# Patient Record
Sex: Female | Born: 1970
Health system: Southern US, Community
[De-identification: ages and names within clinical notes are randomized; demographics above are authoritative.]

## PROBLEM LIST (undated history)

## (undated) DIAGNOSIS — N764 Abscess of vulva: Secondary | ICD-10-CM

## (undated) DIAGNOSIS — O1490 Unspecified pre-eclampsia, unspecified trimester: Secondary | ICD-10-CM

## (undated) DIAGNOSIS — O09899 Supervision of other high risk pregnancies, unspecified trimester: Secondary | ICD-10-CM

## (undated) DIAGNOSIS — B019 Varicella without complication: Secondary | ICD-10-CM

## (undated) DIAGNOSIS — E785 Hyperlipidemia, unspecified: Secondary | ICD-10-CM

## (undated) DIAGNOSIS — Z0000 Encounter for general adult medical examination without abnormal findings: Principal | ICD-10-CM

## (undated) DIAGNOSIS — I8390 Asymptomatic varicose veins of unspecified lower extremity: Secondary | ICD-10-CM

## (undated) DIAGNOSIS — I82409 Acute embolism and thrombosis of unspecified deep veins of unspecified lower extremity: Secondary | ICD-10-CM

## (undated) DIAGNOSIS — C50919 Malignant neoplasm of unspecified site of unspecified female breast: Secondary | ICD-10-CM

## (undated) HISTORY — DX: Acute embolism and thrombosis of unspecified deep veins of unspecified lower extremity: I82.409

## (undated) HISTORY — DX: Varicella without complication: B01.9

## (undated) HISTORY — DX: Supervision of other high risk pregnancies, unspecified trimester: O09.899

## (undated) HISTORY — DX: Abscess of vulva: N76.4

## (undated) HISTORY — PX: WISDOM TOOTH EXTRACTION: SHX21

## (undated) HISTORY — DX: Encounter for general adult medical examination without abnormal findings: Z00.00

## (undated) HISTORY — DX: Hyperlipidemia, unspecified: E78.5

## (undated) HISTORY — DX: Unspecified pre-eclampsia, unspecified trimester: O14.90

## (undated) HISTORY — PX: VARICOSE VEIN SURGERY: SHX832

## (undated) HISTORY — DX: Malignant neoplasm of unspecified site of unspecified female breast: C50.919

## (undated) HISTORY — DX: Asymptomatic varicose veins of unspecified lower extremity: I83.90

---

## 2003-06-25 DIAGNOSIS — O1490 Unspecified pre-eclampsia, unspecified trimester: Secondary | ICD-10-CM

## 2003-06-25 DIAGNOSIS — O1420 HELLP syndrome (HELLP), unspecified trimester: Secondary | ICD-10-CM

## 2012-08-04 ENCOUNTER — Encounter: Payer: Self-pay | Admitting: Family Medicine

## 2012-08-04 ENCOUNTER — Ambulatory Visit (INDEPENDENT_AMBULATORY_CARE_PROVIDER_SITE_OTHER): Payer: BC Managed Care – PPO | Admitting: Family Medicine

## 2012-08-04 VITALS — BP 128/92 | HR 68 | Temp 98.6°F | Ht 64.0 in | Wt 136.8 lb

## 2012-08-04 DIAGNOSIS — R030 Elevated blood-pressure reading, without diagnosis of hypertension: Secondary | ICD-10-CM

## 2012-08-04 DIAGNOSIS — I8390 Asymptomatic varicose veins of unspecified lower extremity: Secondary | ICD-10-CM

## 2012-08-04 DIAGNOSIS — O1490 Unspecified pre-eclampsia, unspecified trimester: Secondary | ICD-10-CM | POA: Insufficient documentation

## 2012-08-04 DIAGNOSIS — O09299 Supervision of pregnancy with other poor reproductive or obstetric history, unspecified trimester: Secondary | ICD-10-CM

## 2012-08-04 DIAGNOSIS — N764 Abscess of vulva: Secondary | ICD-10-CM | POA: Insufficient documentation

## 2012-08-04 DIAGNOSIS — Z8759 Personal history of other complications of pregnancy, childbirth and the puerperium: Secondary | ICD-10-CM | POA: Insufficient documentation

## 2012-08-04 DIAGNOSIS — O09899 Supervision of other high risk pregnancies, unspecified trimester: Secondary | ICD-10-CM

## 2012-08-04 DIAGNOSIS — I82409 Acute embolism and thrombosis of unspecified deep veins of unspecified lower extremity: Secondary | ICD-10-CM

## 2012-08-04 DIAGNOSIS — IMO0001 Reserved for inherently not codable concepts without codable children: Secondary | ICD-10-CM

## 2012-08-04 DIAGNOSIS — Z0000 Encounter for general adult medical examination without abnormal findings: Secondary | ICD-10-CM | POA: Insufficient documentation

## 2012-08-04 DIAGNOSIS — E785 Hyperlipidemia, unspecified: Secondary | ICD-10-CM

## 2012-08-04 DIAGNOSIS — I82401 Acute embolism and thrombosis of unspecified deep veins of right lower extremity: Secondary | ICD-10-CM

## 2012-08-04 DIAGNOSIS — O09893 Supervision of other high risk pregnancies, third trimester: Secondary | ICD-10-CM

## 2012-08-04 HISTORY — DX: Supervision of other high risk pregnancies, unspecified trimester: O09.899

## 2012-08-04 HISTORY — DX: Abscess of vulva: N76.4

## 2012-08-04 HISTORY — DX: Asymptomatic varicose veins of unspecified lower extremity: I83.90

## 2012-08-04 HISTORY — DX: Encounter for general adult medical examination without abnormal findings: Z00.00

## 2012-08-04 HISTORY — DX: Hyperlipidemia, unspecified: E78.5

## 2012-08-04 LAB — CBC
HCT: 34.3 % — ABNORMAL LOW (ref 36.0–46.0)
Hemoglobin: 11.7 g/dL — ABNORMAL LOW (ref 12.0–15.0)
MCHC: 34 g/dL (ref 30.0–36.0)
MCV: 95 fl (ref 78.0–100.0)
RDW: 12.8 % (ref 11.5–14.6)

## 2012-08-04 LAB — TSH: TSH: 0.44 u[IU]/mL (ref 0.35–5.50)

## 2012-08-04 LAB — LIPID PANEL
Cholesterol: 207 mg/dL — ABNORMAL HIGH (ref 0–200)
HDL: 102.2 mg/dL (ref >39.00)
Triglycerides: 108 mg/dL (ref 0.0–149.0)

## 2012-08-04 LAB — RENAL FUNCTION PANEL
BUN: 15 mg/dL (ref 6–23)
Chloride: 101 mEq/L (ref 96–112)
Creatinine, Ser: 0.6 mg/dL (ref 0.4–1.2)
GFR: 124.82 mL/min (ref >60.00)
Glucose, Bld: 80 mg/dL (ref 70–99)
Phosphorus: 2.7 mg/dL (ref 2.3–4.6)

## 2012-08-04 LAB — HEPATIC FUNCTION PANEL
Albumin: 4.2 g/dL (ref 3.5–5.2)
Alkaline Phosphatase: 68 U/L (ref 39–117)
Total Protein: 7.6 g/dL (ref 6.0–8.3)

## 2012-08-04 LAB — C-REACTIVE PROTEIN: CRP: <0.5 mg/dL (ref 0.5–20.0)

## 2012-08-04 NOTE — Assessment & Plan Note (Signed)
Avoid trans fats, consider a krill oil cap daily, recheck in 1 year

## 2012-08-04 NOTE — Patient Instructions (Signed)
Probiotic such as Digestive Advantage  For the folliculitis alternate distilled white vinegar and Witch Hazel  Preventive Care for Adults, Female A healthy lifestyle and preventive care can promote health and wellness. Preventive health guidelines for women include the following key practices.  A routine yearly physical is a good way to check with your caregiver about your health and preventive screening. It is a chance to share any concerns and updates on your health, and to receive a thorough exam.  Visit your dentist for a routine exam and preventive care every 6 months. Brush your teeth twice a day and floss once a day. Good oral hygiene prevents tooth decay and gum disease.  The frequency of eye exams is based on your age, health, family medical history, use of contact lenses, and other factors. Follow your caregiver's recommendations for frequency of eye exams.  Eat a healthy diet. Foods like vegetables, fruits, whole grains, low-fat dairy products, and lean protein foods contain the nutrients you need without too many calories. Decrease your intake of foods high in solid fats, added sugars, and salt. Eat the right amount of calories for you.Get information about a proper diet from your caregiver, if necessary.  Regular physical exercise is one of the most important things you can do for your health. Most adults should get at least 150 minutes of moderate-intensity exercise (any activity that increases your heart rate and causes you to sweat) each week. In addition, most adults need muscle-strengthening exercises on 2 or more days a week.  Maintain a healthy weight. The body mass index (BMI) is a screening tool to identify possible weight problems. It provides an estimate of body fat based on height and weight. Your caregiver can help determine your BMI, and can help you achieve or maintain a healthy weight.For adults 20 years and older:  A BMI below 18.5 is considered underweight.  A BMI  of 18.5 to 24.9 is normal.  A BMI of 25 to 29.9 is considered overweight.  A BMI of 30 and above is considered obese.  Maintain normal blood lipids and cholesterol levels by exercising and minimizing your intake of saturated fat. Eat a balanced diet with plenty of fruit and vegetables. Blood tests for lipids and cholesterol should begin at age 52 and be repeated every 5 years. If your lipid or cholesterol levels are high, you are over 50, or you are at high risk for heart disease, you may need your cholesterol levels checked more frequently.Ongoing high lipid and cholesterol levels should be treated with medicines if diet and exercise are not effective.  If you smoke, find out from your caregiver how to quit. If you do not use tobacco, do not start.  If you are pregnant, do not drink alcohol. If you are breastfeeding, be very cautious about drinking alcohol. If you are not pregnant and choose to drink alcohol, do not exceed 1 drink per day. One drink is considered to be 12 ounces (355 mL) of beer, 5 ounces (148 mL) of wine, or 1.5 ounces (44 mL) of liquor.  Avoid use of street drugs. Do not share needles with anyone. Ask for help if you need support or instructions about stopping the use of drugs.  High blood pressure causes heart disease and increases the risk of stroke. Your blood pressure should be checked at least every 1 to 2 years. Ongoing high blood pressure should be treated with medicines if weight loss and exercise are not effective.  If you are 44  to 40 years old, ask your caregiver if you should take aspirin to prevent strokes.  Diabetes screening involves taking a blood sample to check your fasting blood sugar level. This should be done once every 3 years, after age 57, if you are within normal weight and without risk factors for diabetes. Testing should be considered at a younger age or be carried out more frequently if you are overweight and have at least 1 risk factor for  diabetes.  Breast cancer screening is essential preventive care for women. You should practice "breast self-awareness." This means understanding the normal appearance and feel of your breasts and may include breast self-examination. Any changes detected, no matter how small, should be reported to a caregiver. Women in their 7s and 30s should have a clinical breast exam (CBE) by a caregiver as part of a regular health exam every 1 to 3 years. After age 73, women should have a CBE every year. Starting at age 65, women should consider having a mammography (breast X-ray test) every year. Women who have a family history of breast cancer should talk to their caregiver about genetic screening. Women at a high risk of breast cancer should talk to their caregivers about having magnetic resonance imaging (MRI) and a mammography every year.  The Pap test is a screening test for cervical cancer. A Pap test can show cell changes on the cervix that might become cervical cancer if left untreated. A Pap test is a procedure in which cells are obtained and examined from the lower end of the uterus (cervix).  Women should have a Pap test starting at age 3.  Between ages 92 and 75, Pap tests should be repeated every 2 years.  Beginning at age 32, you should have a Pap test every 3 years as long as the past 3 Pap tests have been normal.  Some women have medical problems that increase the chance of getting cervical cancer. Talk to your caregiver about these problems. It is especially important to talk to your caregiver if a new problem develops soon after your last Pap test. In these cases, your caregiver may recommend more frequent screening and Pap tests.  The above recommendations are the same for women who have or have not gotten the vaccine for human papillomavirus (HPV).  If you had a hysterectomy for a problem that was not cancer or a condition that could lead to cancer, then you no longer need Pap tests. Even if  you no longer need a Pap test, a regular exam is a good idea to make sure no other problems are starting.  If you are between ages 63 and 41, and you have had normal Pap tests going back 10 years, you no longer need Pap tests. Even if you no longer need a Pap test, a regular exam is a good idea to make sure no other problems are starting.  If you have had past treatment for cervical cancer or a condition that could lead to cancer, you need Pap tests and screening for cancer for at least 20 years after your treatment.  If Pap tests have been discontinued, risk factors (such as a new sexual partner) need to be reassessed to determine if screening should be resumed.  The HPV test is an additional test that may be used for cervical cancer screening. The HPV test looks for the virus that can cause the cell changes on the cervix. The cells collected during the Pap test can be tested for  HPV. The HPV test could be used to screen women aged 26 years and older, and should be used in women of any age who have unclear Pap test results. After the age of 18, women should have HPV testing at the same frequency as a Pap test.  Colorectal cancer can be detected and often prevented. Most routine colorectal cancer screening begins at the age of 43 and continues through age 31. However, your caregiver may recommend screening at an earlier age if you have risk factors for colon cancer. On a yearly basis, your caregiver may provide home test kits to check for hidden blood in the stool. Use of a small camera at the end of a tube, to directly examine the colon (sigmoidoscopy or colonoscopy), can detect the earliest forms of colorectal cancer. Talk to your caregiver about this at age 79, when routine screening begins. Direct examination of the colon should be repeated every 5 to 10 years through age 37, unless early forms of pre-cancerous polyps or small growths are found.  Hepatitis C blood testing is recommended for all  people born from [birth year] through 1965 and any individual with known risks for hepatitis C.  Practice safe sex. Use condoms and avoid high-risk sexual practices to reduce the spread of sexually transmitted infections (STIs). STIs include gonorrhea, chlamydia, syphilis, trichomonas, herpes, HPV, and human immunodeficiency virus (HIV). Herpes, HIV, and HPV are viral illnesses that have no cure. They can result in disability, cancer, and death. Sexually active women aged 43 and younger should be checked for chlamydia. Older women with new or multiple partners should also be tested for chlamydia. Testing for other STIs is recommended if you are sexually active and at increased risk.  Osteoporosis is a disease in which the bones lose minerals and strength with aging. This can result in serious bone fractures. The risk of osteoporosis can be identified using a bone density scan. Women ages 75 and over and women at risk for fractures or osteoporosis should discuss screening with their caregivers. Ask your caregiver whether you should take a calcium supplement or vitamin D to reduce the rate of osteoporosis.  Menopause can be associated with physical symptoms and risks. Hormone replacement therapy is available to decrease symptoms and risks. You should talk to your caregiver about whether hormone replacement therapy is right for you.  Use sunscreen with sun protection factor (SPF) of 30 or more. Apply sunscreen liberally and repeatedly throughout the day. You should seek shade when your shadow is shorter than you. Protect yourself by wearing long sleeves, pants, a wide-brimmed hat, and sunglasses year round, whenever you are outdoors.  Once a month, do a whole body skin exam, using a mirror to look at the skin on your back. Notify your caregiver of new moles, moles that have irregular borders, moles that are larger than a pencil eraser, or moles that have changed in shape or color.  Stay current with required  immunizations.  Influenza. You need a dose every fall (or winter). The composition of the flu vaccine changes each year, so being vaccinated once is not enough.  Pneumococcal polysaccharide. You need 1 to 2 doses if you smoke cigarettes or if you have certain chronic medical conditions. You need 1 dose at age 28 (or older) if you have never been vaccinated.  Tetanus, diphtheria, pertussis (Tdap, Td). Get 1 dose of Tdap vaccine if you are younger than age 64, are over 45 and have contact with an infant, are a Research scientist (physical sciences),  are pregnant, or simply want to be protected from whooping cough. After that, you need a Td booster dose every 10 years. Consult your caregiver if you have not had at least 3 tetanus and diphtheria-containing shots sometime in your life or have a deep or dirty wound.  HPV. You need this vaccine if you are a woman age 59 or younger. The vaccine is given in 3 doses over 6 months.  Measles, mumps, rubella (MMR). You need at least 1 dose of MMR if you were born in 1957 or later. You may also need a second dose.  Meningococcal. If you are age 1 to 68 and a first-year college student living in a residence hall, or have one of several medical conditions, you need to get vaccinated against meningococcal disease. You may also need additional booster doses.  Zoster (shingles). If you are age 53 or older, you should get this vaccine.  Varicella (chickenpox). If you have never had chickenpox or you were vaccinated but received only 1 dose, talk to your caregiver to find out if you need this vaccine.  Hepatitis A. You need this vaccine if you have a specific risk factor for hepatitis A virus infection or you simply wish to be protected from this disease. The vaccine is usually given as 2 doses, 6 to 18 months apart.  Hepatitis B. You need this vaccine if you have a specific risk factor for hepatitis B virus infection or you simply wish to be protected from this disease. The vaccine is  given in 3 doses, usually over 6 months. Preventive Services / Frequency Ages 75 to 76  Blood pressure check.** / Every 1 to 2 years.  Lipid and cholesterol check.** / Every 5 years beginning at age 31.  Clinical breast exam.** / Every 3 years for women in their 2s and 30s.  Pap test.** / Every 2 years from ages 39 through 24. Every 3 years starting at age 6 through age 15 or 55 with a history of 3 consecutive normal Pap tests.  HPV screening.** / Every 3 years from ages 81 through ages 25 to 59 with a history of 3 consecutive normal Pap tests.  Hepatitis C blood test.** / For any individual with known risks for hepatitis C.  Skin self-exam. / Monthly.  Influenza immunization.** / Every year.  Pneumococcal polysaccharide immunization.** / 1 to 2 doses if you smoke cigarettes or if you have certain chronic medical conditions.  Tetanus, diphtheria, pertussis (Tdap, Td) immunization. / A one-time dose of Tdap vaccine. After that, you need a Td booster dose every 10 years.  HPV immunization. / 3 doses over 6 months, if you are 31 and younger.  Measles, mumps, rubella (MMR) immunization. / You need at least 1 dose of MMR if you were born in 1957 or later. You may also need a second dose.  Meningococcal immunization. / 1 dose if you are age 60 to 63 and a first-year college student living in a residence hall, or have one of several medical conditions, you need to get vaccinated against meningococcal disease. You may also need additional booster doses.  Varicella immunization.** / Consult your caregiver.  Hepatitis A immunization.** / Consult your caregiver. 2 doses, 6 to 18 months apart.  Hepatitis B immunization.** / Consult your caregiver. 3 doses usually over 6 months. Ages 27 to 54  Blood pressure check.** / Every 1 to 2 years.  Lipid and cholesterol check.** / Every 5 years beginning at age 46.  Clinical  breast exam.** / Every year after age 59.  Mammogram.** / Every year  beginning at age 62 and continuing for as long as you are in good health. Consult with your caregiver.  Pap test.** / Every 3 years starting at age 4 through age 65 or 33 with a history of 3 consecutive normal Pap tests.  HPV screening.** / Every 3 years from ages 72 through ages 61 to 37 with a history of 3 consecutive normal Pap tests.  Fecal occult blood test (FOBT) of stool. / Every year beginning at age 12 and continuing until age 65. You may not need to do this test if you get a colonoscopy every 10 years.  Flexible sigmoidoscopy or colonoscopy.** / Every 5 years for a flexible sigmoidoscopy or every 10 years for a colonoscopy beginning at age 17 and continuing until age 42.  Hepatitis C blood test.** / For all people born from [birth year] through 1965 and any individual with known risks for hepatitis C.  Skin self-exam. / Monthly.  Influenza immunization.** / Every year.  Pneumococcal polysaccharide immunization.** / 1 to 2 doses if you smoke cigarettes or if you have certain chronic medical conditions.  Tetanus, diphtheria, pertussis (Tdap, Td) immunization.** / A one-time dose of Tdap vaccine. After that, you need a Td booster dose every 10 years.  Measles, mumps, rubella (MMR) immunization. / You need at least 1 dose of MMR if you were born in 1957 or later. You may also need a second dose.  Varicella immunization.** / Consult your caregiver.  Meningococcal immunization.** / Consult your caregiver.  Hepatitis A immunization.** / Consult your caregiver. 2 doses, 6 to 18 months apart.  Hepatitis B immunization.** / Consult your caregiver. 3 doses, usually over 6 months. Ages 50 and over  Blood pressure check.** / Every 1 to 2 years.  Lipid and cholesterol check.** / Every 5 years beginning at age 13.  Clinical breast exam.** / Every year after age 18.  Mammogram.** / Every year beginning at age 79 and continuing for as long as you are in good health. Consult with your  caregiver.  Pap test.** / Every 3 years starting at age 46 through age 12 or 59 with a 3 consecutive normal Pap tests. Testing can be stopped between 65 and 70 with 3 consecutive normal Pap tests and no abnormal Pap or HPV tests in the past 10 years.  HPV screening.** / Every 3 years from ages 6 through ages 19 or 40 with a history of 3 consecutive normal Pap tests. Testing can be stopped between 65 and 70 with 3 consecutive normal Pap tests and no abnormal Pap or HPV tests in the past 10 years.  Fecal occult blood test (FOBT) of stool. / Every year beginning at age 48 and continuing until age 69. You may not need to do this test if you get a colonoscopy every 10 years.  Flexible sigmoidoscopy or colonoscopy.** / Every 5 years for a flexible sigmoidoscopy or every 10 years for a colonoscopy beginning at age 35 and continuing until age 30.  Hepatitis C blood test.** / For all people born from [birth year] through 1965 and any individual with known risks for hepatitis C.  Osteoporosis screening.** / A one-time screening for women ages 32 and over and women at risk for fractures or osteoporosis.  Skin self-exam. / Monthly.  Influenza immunization.** / Every year.  Pneumococcal polysaccharide immunization.** / 1 dose at age 57 (or older) if you have never been vaccinated.  Tetanus, diphtheria, pertussis (Tdap, Td) immunization. / A one-time dose of Tdap vaccine if you are over 65 and have contact with an infant, are a Research scientist (physical sciences), or simply want to be protected from whooping cough. After that, you need a Td booster dose every 10 years.  Varicella immunization.** / Consult your caregiver.  Meningococcal immunization.** / Consult your caregiver.  Hepatitis A immunization.** / Consult your caregiver. 2 doses, 6 to 18 months apart.  Hepatitis B immunization.** / Check with your caregiver. 3 doses, usually over 6 months. ** Family history and personal history of risk and conditions may change your  caregiver's recommendations. Document Released: 06/10/2001 Document Revised: 07/07/2011 Document Reviewed: 09/09/2010 [facility] Patient Information 2013 [place], [place].

## 2012-08-04 NOTE — Assessment & Plan Note (Signed)
Encouraged DASH diet and recheck at next visit.

## 2012-08-04 NOTE — Progress Notes (Signed)
Patient ID: Brandy Parsons, female   DOB: 05-05-1970, 42 y.o.   MRN: [id number] Brandy Parsons [id number] 1970-05-27 08/04/2012      Progress Note New Patient  Subjective  Chief Complaint  Chief Complaint  Patient presents with  . Establish Care    new patient    HPI  Patient is a 42 year old Caucasian female is recently relocated to the area and is in need of a primary .care physician. In general she's in good health. She has suffered with recurrent vulvar abscess for several years now. Has had it surgically once it recurred. It intermittently grows and drains to this day. Comes warm and uncomfortable. No recent trouble. No fevers or chills. No malaise or myalgias. Denies headaches chest pains palpitations shortness of breath GI complaints today. Her last pregnancy was very complicated by preeclampsia and ultimately helpp syndrome. Her son was born very prematurely at 26-1/2 weeks.   Past Medical History  Diagnosis Date  . Chicken pox as a child  . Pre-eclampsia at 26 weeks  . Previous pregnancy with HELLP syndrome, antepartum 08/04/2012  . Varicose veins 08/04/2012  . Other and unspecified hyperlipidemia 08/04/2012  . Preventative health care 08/04/2012  . DVT (deep venous thrombosis)     right after C section  . Vulvar abscess 08/04/2012    Past Surgical History  Procedure Laterality Date  . Cesarean section  06-25-03  . Wisdom tooth extraction  early 20's  . Varicose vein surgery Left     Family History  Problem Relation Age of Onset  . Dementia Maternal Grandmother     late age  . Alzheimer's disease Maternal Grandfather     History   Social History  . Marital Status: Married    Spouse Name: N/A    Number of Children: N/A  . Years of Education: N/A   Occupational History  . Not on file.   Social History Main Topics  . Smoking status: Never Smoker   . Smokeless tobacco: Never Used  . Alcohol Use: Yes     Comment: wine/beer occasionally  . Drug Use: No  . Sexually  Active: Yes -- Female partner(s)   Other Topics Concern  . Not on file   Social History Narrative  . No narrative on file    No current outpatient prescriptions on file prior to visit.   No current facility-administered medications on file prior to visit.    No Known Allergies  Review of Systems  Review of Systems  Constitutional: Negative for fever, chills and malaise/fatigue.  HENT: Negative for hearing loss, nosebleeds and congestion.   Eyes: Negative for discharge.  Respiratory: Negative for cough, sputum production, shortness of breath and wheezing.   Cardiovascular: Negative for chest pain, palpitations and leg swelling.  Gastrointestinal: Negative for heartburn, nausea, vomiting, abdominal pain, diarrhea, constipation and blood in stool.  Genitourinary: Negative for dysuria, urgency, frequency and hematuria.  Musculoskeletal: Negative for myalgias, back pain and falls.  Skin: Negative for rash.  Neurological: Negative for dizziness, tremors, sensory change, focal weakness, loss of consciousness, weakness and headaches.  Endo/Heme/Allergies: Negative for polydipsia. Does not bruise/bleed easily.  Psychiatric/Behavioral: Negative for depression and suicidal ideas. The patient is not nervous/anxious and does not have insomnia.     Objective  BP 148/89  Pulse 68  Temp(Src) 98.6 F (37 C) (Temporal)  Ht 5\' 4"  (1.626 m)  Wt 136 lb 12.8 oz (62.052 kg)  BMI 23.47 kg/m2  SpO2 100%  LMP 07/20/2012  Physical Exam  Physical  Exam  Constitutional: She is oriented to person, place, and time and well-developed, well-nourished, and in no distress. No distress.  HENT:  Head: Normocephalic and atraumatic.  Right Ear: External ear normal.  Left Ear: External ear normal.  Nose: Nose normal.  Mouth/Throat: Oropharynx is clear and moist. No oropharyngeal exudate.  Eyes: Conjunctivae are normal. Pupils are equal, round, and reactive to light. Right eye exhibits no discharge. Left  eye exhibits no discharge. No scleral icterus.  Neck: Normal range of motion. Neck supple. No thyromegaly present.  Cardiovascular: Normal rate, regular rhythm, normal heart sounds and intact distal pulses.   No murmur heard. Pulmonary/Chest: Effort normal and breath sounds normal. No respiratory distress. She has no wheezes. She has no rales.  Abdominal: Soft. Bowel sounds are normal. She exhibits no distension and no mass. There is no tenderness.  Musculoskeletal: Normal range of motion. She exhibits no edema and no tenderness.  Lymphadenopathy:    She has no cervical adenopathy.  Neurological: She is alert and oriented to person, place, and time. She has normal reflexes. No cranial nerve deficit. Coordination normal.  Skin: Skin is warm and dry. No rash noted. She is not diaphoretic.  Psychiatric: Mood, memory and affect normal.       Assessment & Plan  DVT (deep venous thrombosis) pregnancy related  Other and unspecified hyperlipidemia Avoid trans fats, consider a krill oil cap daily, recheck in 1 year   Varicose veins As tolerated left leg strippping and is going to proceed with right leg soon  Preventative health care Encouraged regular exercise, avoid trans fats, heart healthy diet. Referred to OB/GYN for evaluation   Vulvar abscess Present and worse and better now for several years. New to the area will refer to GYN for further consideration  Elevated BP Encouraged DASH diet and recheck at next visit.

## 2012-08-04 NOTE — Assessment & Plan Note (Signed)
As tolerated left leg strippping and is going to proceed with right leg soon

## 2012-08-04 NOTE — Assessment & Plan Note (Signed)
Present and worse and better now for several years. New to the area will refer to GYN for further consideration

## 2012-08-04 NOTE — Assessment & Plan Note (Signed)
Encouraged regular exercise, avoid trans fats, heart healthy diet. Referred to OB/GYN for evaluation

## 2012-08-04 NOTE — Assessment & Plan Note (Signed)
pregnancy related

## 2012-11-03 ENCOUNTER — Ambulatory Visit: Payer: BC Managed Care – PPO | Admitting: Family Medicine

## 2012-12-22 ENCOUNTER — Other Ambulatory Visit: Payer: Self-pay | Admitting: Obstetrics and Gynecology

## 2012-12-22 DIAGNOSIS — Z1231 Encounter for screening mammogram for malignant neoplasm of breast: Secondary | ICD-10-CM

## 2012-12-28 ENCOUNTER — Ambulatory Visit: Payer: BC Managed Care – PPO | Admitting: Family Medicine

## 2012-12-31 ENCOUNTER — Telehealth: Payer: Self-pay | Admitting: Hematology & Oncology

## 2012-12-31 NOTE — Telephone Encounter (Signed)
Left pt message to call and schedule appointment °

## 2013-01-03 ENCOUNTER — Telehealth: Payer: Self-pay | Admitting: Hematology & Oncology

## 2013-01-03 NOTE — Telephone Encounter (Signed)
Left pt message to call and schedule appointment °

## 2013-01-04 ENCOUNTER — Telehealth: Payer: Self-pay | Admitting: Hematology & Oncology

## 2013-01-04 NOTE — Telephone Encounter (Signed)
Pt stated she would call back to schedule, she got my message's and has seen a vein specialist so is not sure why she needs to see [place].

## 2013-01-06 ENCOUNTER — Telehealth: Payer: Self-pay | Admitting: Hematology & Oncology

## 2013-01-06 NOTE — Telephone Encounter (Signed)
Pt would only come at 9am. She felt she really didn't need to come but scheduled 02-28-13. Unable to reach referring.

## 2013-01-20 ENCOUNTER — Ambulatory Visit
Admission: RE | Admit: 2013-01-20 | Discharge: 2013-01-20 | Disposition: A | Discharge status: Home / Self Care | Payer: BC Managed Care – PPO | Source: Ambulatory Visit | Attending: Obstetrics and Gynecology | Admitting: Obstetrics and Gynecology

## 2013-01-20 DIAGNOSIS — Z1231 Encounter for screening mammogram for malignant neoplasm of breast: Secondary | ICD-10-CM

## 2013-02-04 ENCOUNTER — Ambulatory Visit: Payer: BC Managed Care – PPO | Admitting: Family Medicine

## 2013-02-07 ENCOUNTER — Encounter: Payer: Self-pay | Admitting: Family Medicine

## 2013-02-07 ENCOUNTER — Ambulatory Visit (INDEPENDENT_AMBULATORY_CARE_PROVIDER_SITE_OTHER): Payer: BC Managed Care – PPO | Admitting: Family Medicine

## 2013-02-07 VITALS — BP 110/70 | HR 71 | Temp 98.5°F | Ht 64.0 in | Wt 137.1 lb

## 2013-02-07 DIAGNOSIS — Z23 Encounter for immunization: Secondary | ICD-10-CM

## 2013-02-07 NOTE — Progress Notes (Signed)
This encounter was used  As a nurse visit only the patient was not seen by the provider.The Flu shot was provided to the patient by Josph Macho ,CMA

## 2013-02-25 ENCOUNTER — Telehealth: Payer: Self-pay | Admitting: Hematology & Oncology

## 2013-02-25 NOTE — Telephone Encounter (Signed)
Left vm w NEW PATIENT today to remind them of their appointment with Dr. Ennever. Also, advised them to bring all meds and insurance information. ° °

## 2013-02-28 ENCOUNTER — Ambulatory Visit: Payer: BC Managed Care – PPO

## 2013-02-28 ENCOUNTER — Ambulatory Visit (HOSPITAL_BASED_OUTPATIENT_CLINIC_OR_DEPARTMENT_OTHER): Payer: BC Managed Care – PPO | Admitting: Hematology & Oncology

## 2013-02-28 ENCOUNTER — Other Ambulatory Visit: Payer: BC Managed Care – PPO | Admitting: Lab

## 2013-02-28 VITALS — BP 122/74 | HR 80 | Temp 98.2°F | Resp 14 | Ht 64.0 in | Wt 138.0 lb

## 2013-02-28 DIAGNOSIS — I82402 Acute embolism and thrombosis of unspecified deep veins of left lower extremity: Secondary | ICD-10-CM

## 2013-02-28 DIAGNOSIS — Z86718 Personal history of other venous thrombosis and embolism: Secondary | ICD-10-CM

## 2013-02-28 NOTE — Progress Notes (Signed)
This office note has been dictated.

## 2013-03-01 NOTE — Progress Notes (Signed)
CC:   Crist Fat. Rivard, M.D.  DIAGNOSIS:  Deep venous thrombosis, postpartum of the left leg - 9 years ago.  HISTORY OF PRESENT ILLNESS:  Brandy Parsons is a very nice 42 year old white female.  She recently moved to the area from [place].  The patient is followed by Dr. Dois Davenport Rivard.  [name] has had 2 children.  She has been on oral contraceptives for 20 years.  She had no problems with her first child.  There were no problems postpartum with that child.  With the second child, 9 years ago, she needed a C-section.  The child was born prematurely.  She subsequently developed a clot in the left leg.  She says that behind the left knee it became swollen and red and painful.  This was when she got home.  She was found to have a deep venous thrombus.  We do not have any information about the extent of this thrombus.  Because her child was in the intensive care unit for a while, she really was not too concerned about having to take a blood thinner.  She is not sure what she took.  She was not sure how long she was on blood thinner.  Her gynecologist in [place], [place], put her back on her oral contraceptives.  She has been on these for 9 years.  She now sees Dr. Estanislado Pandy.  Because of this history of DVT, Dr. Estanislado Pandy felt that she needed to be off oral contraceptives.  Dr. Estanislado Pandy did a thorough hypercoagulable workup.  All of [name]'s studies came back okay.  Dr. Estanislado Pandy felt that a hematologic evaluation was needed to help with oral contraceptive management.  [name] really wants to be on oral contraceptives.  She says that she really has never had a problem with these.  There is no family history of miscarriages or spontaneous abortions. There is no history of actual thromboembolic events.  [name] is pretty active.  She tries to exercise.  PAST MEDICAL HISTORY:  This is pretty much unremarkable.  She has had a cyst in the vaginal area.  This  has been drained once.  It seems like it is coming back now.  ALLERGIES:  None.  MEDICATIONS: 1. Oral contraceptive. 2. Fish oil. 3. Multivitamin. 4. Probiotic.  SOCIAL HISTORY:  Negative for tobacco use.  There may be rare alcohol use.  She has no occupational exposures.  FAMILY HISTORY:  Pretty much unremarkable for thromboembolic disease. There is no history of malignancy in the family.  REVIEW OF SYSTEMS:  As stated in history of present illness.  No additional findings are noted on a 12-system review.  PHYSICAL EXAMINATION:  GENERAL:  This is a well-developed, well- nourished white female in no obvious distress.  She is alert and oriented x3. VITAL SIGNS:  Temperature of 98.2, pulse 80, respiratory rate 18, blood pressure 122/74, weight is 138. HEENT:  Head and neck exam shows a normocephalic, atraumatic skull.  She has no ocular or oral lesions.  There is no mucositis in the oral cavity. NECK:  She has no adenopathy in the neck.  Thyroid is nonpalpable. LUNGS:  Clear to percussion and auscultation bilaterally. CARDIAC:  Regular rate and rhythm with normal S1 and S2.  There are no murmurs, rubs, or bruits. ABDOMEN:  Soft.  She has good bowel sounds.  There is no fluid wave. There is palpable hepatosplenomegaly. BACK:  No tenderness over the spine, ribs, or hips. EXTREMITIES:  Show no clubbing,  cyanosis, or edema.  No palpable venous cord is noted in the lower legs.  She has good range motion of her joints.  She has negative Homan's sign in her legs. SKIN:  No rashes, ecchymosis, or petechia. NEUROLOGICAL:  Shows no focal neurological deficits.  LABORATORY DATA:  Not done this visit.  IMPRESSION:  Brandy Parsons is a very charming 42 year old white female. She had a postpartum deep venous thrombosis 9 years ago.  She is not sure where it was actually located or what she took and how long she took it.  She was on oral contraceptives prior to this event.  She had a  pregnancy without any difficulties.  I think that it would be okay for her to go onto oral contraceptives. It seemed as if the deep venous thrombosis that she had was in relation to her having surgery with her second pregnancy.  She had a thrombophilic workup.  This was all normal.  I think that it would be wise for her to go onto aspirin.  I would recommend 2 baby aspirin a day.  This would help with her.  She does understand that there could certainly be a risk of another thromboembolic event.  If so, then she will clearly need to be off oral contraceptives forever.  I spent a good 45 minutes with her.  I went over the thrombophilic studies that she had done.  She is very appreciative of Dr. Cloretta Ned concern, but [name] feels safe to go back onto her oral contraceptives.  Again, I think that Ms. Breeding can go back on oral contraceptives.  I want to make sure that she does take her aspirin.  I think this will be helpful for her.  Again, I do not need to see Ms. Pudlo back into the office unless she does have a thromboembolic event.    ______________________________ Josph Macho, M.D. PRE/MEDQ  D:  02/28/2013  T:  03/01/2013  Job:  [id number]

## 2014-01-20 ENCOUNTER — Encounter: Payer: BC Managed Care – PPO | Admitting: Gynecology

## 2014-02-10 ENCOUNTER — Telehealth: Payer: Self-pay | Admitting: Gynecology

## 2014-02-10 NOTE — Telephone Encounter (Signed)
Called patient and left message to call back to reschedule her new patient appointment with Dr. Farrel GobbleLathrop on 02/24/14.

## 2014-02-24 ENCOUNTER — Encounter: Payer: BC Managed Care – PPO | Admitting: Gynecology

## 2014-04-18 ENCOUNTER — Ambulatory Visit: Payer: BC Managed Care – PPO | Admitting: Family Medicine

## 2014-05-30 ENCOUNTER — Ambulatory Visit (INDEPENDENT_AMBULATORY_CARE_PROVIDER_SITE_OTHER): Payer: BLUE CROSS/BLUE SHIELD | Admitting: Nurse Practitioner

## 2014-05-30 ENCOUNTER — Encounter: Payer: Self-pay | Admitting: Nurse Practitioner

## 2014-05-30 VITALS — BP 112/78 | HR 64 | Ht 63.5 in | Wt 143.0 lb

## 2014-05-30 DIAGNOSIS — N9089 Other specified noninflammatory disorders of vulva and perineum: Secondary | ICD-10-CM

## 2014-05-30 DIAGNOSIS — Z01419 Encounter for gynecological examination (general) (routine) without abnormal findings: Secondary | ICD-10-CM

## 2014-05-30 DIAGNOSIS — Z0000 Encounter for general adult medical examination without abnormal findings: Secondary | ICD-10-CM

## 2014-05-30 LAB — POCT URINALYSIS DIPSTICK
Bilirubin, UA: NEGATIVE
Blood, UA: NEGATIVE
Glucose, UA: NEGATIVE
KETONES UA: NEGATIVE
Leukocytes, UA: NEGATIVE
NITRITE UA: NEGATIVE
PROTEIN UA: NEGATIVE
Urobilinogen, UA: NEGATIVE
pH, UA: 6.5

## 2014-05-30 MED ORDER — NORETHIN ACE-ETH ESTRAD-FE 1-20 MG-MCG PO TABS: 1.0000 | ORAL_TABLET | Freq: Every day | ORAL | Status: DC

## 2014-05-30 NOTE — Progress Notes (Signed)
Patient ID: Brandy Parsons, female   DOB: Nov 09, 1970, 44 y.o.   MRN: [id number] 44 y.o. G56P1102 Married  Caucasian Fe here for annual exam.  Menses 3-4 days. Moderate to light.  No cramps, PMS.  History of anemia.  Right vulvar lesion with multiple abscess over the past 3  years.  Previous I&D X 1.  This area usually flares with menses and sometimes very painful causing her to leave work.  She has had several MD to make recommendations and the previous I&D of three layers of dermis - lesion came right back.  Patient's last menstrual period was 05/16/2014.          Sexually active: Yes.    The current method of family planning is OCP (estrogen/progesterone).    Exercising: Yes.    walking and active with kids Smoker:  no  Health Maintenance: Pap:  2014, normal, no history of abnormal MMG:  01/20/13, Bi-Rads 1:  Negative  TDaP:  04/28/10 Labs:  HB:  11.9  Urine:  Negative    reports that she has never smoked. She has never used smokeless tobacco. She reports that she drinks alcohol. She reports that she does not use illicit drugs.  Past Medical History  Diagnosis Date  . Chicken pox as a child  . Pre-eclampsia at 26 weeks  . Previous pregnancy with HELLP syndrome, antepartum 08/04/2012  . Varicose veins 08/04/2012  . Other and unspecified hyperlipidemia 08/04/2012  . Preventative health care 08/04/2012  . DVT (deep venous thrombosis)     right after C section  . Vulvar abscess 08/04/2012    Past Surgical History  Procedure Laterality Date  . Cesarean section  06-25-03  . Wisdom tooth extraction  early 20's  . Varicose vein surgery Left     Current Outpatient Prescriptions  Medication Sig Dispense Refill  . norethindrone-ethinyl estradiol (JUNEL FE,GILDESS FE,LOESTRIN FE) 1-20 MG-MCG tablet Take 1 tablet by mouth daily. 3 Package 3   No current facility-administered medications for this visit.    Family History  Problem Relation Age of Onset  . Dementia Maternal Grandmother     late age   . Alzheimer's disease Maternal Grandfather     ROS:  Pertinent items are noted in HPI.  Otherwise, a comprehensive ROS was negative.  Exam:   BP 112/78 mmHg  Pulse 64  Ht 5' 3.5" (1.613 m)  Wt 143 lb (64.864 kg)  BMI 24.93 kg/m2  LMP 05/16/2014 Height: 5' 3.5" (161.3 cm) Ht Readings from Last 3 Encounters:  05/30/14 5' 3.5" (1.613 m)  02/28/13  (1.626 m)  02/07/13  (1.626 m)    General appearance: alert, cooperative and appears stated age Head: Normocephalic, without obvious abnormality, atraumatic Neck: no adenopathy, supple, symmetrical, trachea midline and thyroid normal to inspection and palpation Lungs: clear to auscultation bilaterally Breasts: normal appearance, no masses or tenderness Heart: regular rate and rhythm Abdomen: soft, non-tender; no masses,  no organomegaly Extremities: extremities normal, atraumatic, no cyanosis or edema Skin: Skin color, texture, turgor normal. No rashes or lesions Lymph nodes: Cervical, supraclavicular, and axillary nodes normal. No abnormal inguinal nodes palpated Neurologic: Grossly normal   Pelvic: External genitalia:  Lesions from previous abscess without flare at this time. This area is vascular with raised borders.  Irregular looking for a sebaceous cyst.  Asked Dr. Hyacinth Meeker to look at area. - maybe endometriosis lesion.              Urethra:  normal appearing urethra with no  masses, tenderness or lesions              Bartholin's and Skene's: normal                 Vagina: normal appearing vagina with normal color and discharge, no lesions              Cervix: anteverted              Pap taken: Yes.   Bimanual Exam:  Uterus:  normal size, contour, position, consistency, mobility, non-tender              Adnexa: no mass, fullness, tenderness               Rectovaginal: Confirms               Anus:  normal sphincter tone, no lesions  Chaperone present: No Patient was seen with Dr. Hyacinth MeekerMiller  A:  Well Woman with normal  exam  OCP for contraception  Remote  history of DVT after C-Section 11 years ago  Hematology evaluation and approved for OCP use  Will lower OCP dose at this time  Right vulvar lesion that needs further evaluation  P:   Reviewed health and wellness pertinent to exam  Pap smear taken today  Mammogram is due and will schedule  Change OCP to Loestrin 1/20 for 1 year, call back if not doing well with this pill  Counseled on breast self exam, mammography screening, use and side effects of OCP's, adequate intake of calcium and vitamin D, diet and exercise return annually or prn  An After Visit Summary was printed and given to the patient.  ROI for previous records of excision of lesion with pathology about 2012.

## 2014-05-30 NOTE — Patient Instructions (Signed)

## 2014-05-31 ENCOUNTER — Other Ambulatory Visit: Payer: Self-pay

## 2014-05-31 DIAGNOSIS — Z1231 Encounter for screening mammogram for malignant neoplasm of breast: Secondary | ICD-10-CM

## 2014-05-31 LAB — HEMOGLOBIN, FINGERSTICK: Hemoglobin, fingerstick: 11.9 g/dL — ABNORMAL LOW (ref 12.0–16.0)

## 2014-05-31 NOTE — Progress Notes (Addendum)
H/O DVT after pregnancy.  Seen by Dr. Myna HidalgoEnnever with negative evaluation for coagulopathy.  Pt okay'ed to be on OCPs but twice daily ASA recommended.  Reviewed personally.  Lum KeasM. Suzanne Charly Holcomb, MD.

## 2014-06-01 LAB — IPS PAP TEST WITH HPV

## 2014-06-06 ENCOUNTER — Telehealth: Payer: Self-pay | Admitting: Nurse Practitioner

## 2014-06-06 NOTE — Telephone Encounter (Signed)
Left message for pt to call regarding records release.

## 2014-06-06 NOTE — Telephone Encounter (Signed)
Pt returned call and will get release faxed to the correct provider for records.

## 2014-06-19 ENCOUNTER — Telehealth: Payer: Self-pay | Admitting: Nurse Practitioner

## 2014-06-19 DIAGNOSIS — N764 Abscess of vulva: Secondary | ICD-10-CM

## 2014-06-19 NOTE — Telephone Encounter (Signed)
Routing to AshlandPatricia Rolen-Grubb, FNP. Have you seen these records?

## 2014-06-19 NOTE — Telephone Encounter (Signed)
Patient calling to see if we have received [facility] so she can schedule a biopsy.

## 2014-06-20 NOTE — Telephone Encounter (Signed)
These records are here and I placed them on Dr. Garen LahMillers desk last Tuesday.  She is to review and see what next step she wished to take.

## 2014-06-21 ENCOUNTER — Ambulatory Visit: Payer: Self-pay

## 2014-06-21 NOTE — Telephone Encounter (Signed)
Patient notified. She will wait to hear back with message from Dr. Hyacinth MeekerMiller.   cc Dr. Hyacinth MeekerMiller.

## 2014-07-03 NOTE — Telephone Encounter (Signed)
FYI.  I just have the cover page of the fax.  Kennon RoundsSally has called for records again.  I will keep inbox until they arrive.

## 2014-07-03 NOTE — Telephone Encounter (Signed)
Patient calling to check status. Would like to know what streps should she take next

## 2014-07-03 NOTE — Telephone Encounter (Signed)
Routing to Dr Miller for review and advice.  °

## 2014-07-04 NOTE — Telephone Encounter (Signed)
Call to [facility] to have records resent. Records to your office.

## 2014-07-04 NOTE — Addendum Note (Signed)
Addended by: Jerene BearsMILLER, Junie Engram S on: 07/04/2014 03:56 PM   Modules accepted: Kipp BroodSmartSet

## 2014-07-05 NOTE — Telephone Encounter (Signed)
Patient returned call. She is advised that records received and will call back with Dr. Rondel BatonMiller's recommendations. She is agreeable.

## 2014-07-05 NOTE — Telephone Encounter (Addendum)
Call to patient, she is at work and states she will call back.   To advise patient that records have been received, our office needed to request additional records and will call patient back with additional instructions once final review from Dr. Hyacinth MeekerMiller.

## 2014-07-05 NOTE — Telephone Encounter (Signed)
Pathology showed nothing significant, just granulation tissue.  This doesn't really explain the actual cause of it.  I would very much like to do a biopsy of the area when it is having a "flare".  Pt is very anxious about having anything done since lesion was excised and came right back.  Again, still don't have a cause for what it is.  Please see if she is agreeable to this.  Thanks.

## 2014-07-19 NOTE — Telephone Encounter (Signed)
Call to patient, left message to call back to [place]ally or [place].

## 2014-07-25 ENCOUNTER — Ambulatory Visit: Payer: Self-pay

## 2014-07-27 NOTE — Telephone Encounter (Signed)
Message left to return call to [place] at [phone] can speak with Kennon RoundsSally or [place] Anaracy.

## 2014-08-15 NOTE — Telephone Encounter (Signed)
Message left to return call to Ronny Ruddell at [phone].    

## 2014-08-16 NOTE — Telephone Encounter (Signed)
Spoke with patient. She is given message from Dr. Hyacinth MeekerMiller. She states "I pretty much always have a flare of what is going on. I would like to come in any time to have it evaluated."   Patient requests late afternoon appointment. Order placed for vulvar biopsy and instructions given. Scheduled for 09/11/14 at 1530. Patient agreeable and advised would be contacted with insurance benefits coverage.   Routing to provider for final review. Patient agreeable to disposition. Will close encounter   cc Cathrine MusterSabrina Franklin

## 2014-09-07 ENCOUNTER — Telehealth: Payer: Self-pay | Admitting: Obstetrics & Gynecology

## 2014-09-07 NOTE — Telephone Encounter (Signed)
Yes.  This is a chronic issue for her and has been biopsied without any abnormal pathology.  Biopsy was not "diagnostic" so I felt it needed to be repeated.  Ok to change appt.

## 2014-09-07 NOTE — Telephone Encounter (Signed)
Dr.Miller, patient was seen on 05/30/2014 with Brandy FranklinPatricia Rolen-Grubb, FNP. Patient has right vulvar lesion that needs further evaluation. Patient has biopsy scheduled for 5/16 but would like to wait until 6/3. Okay to move appointment?

## 2014-09-07 NOTE — Telephone Encounter (Signed)
Patient canceled upcoming vulvar biopsy 09/11/14 with Dr.Miller. Patient would like to reschedule after 09/29/2014.

## 2014-09-07 NOTE — Telephone Encounter (Signed)
Spoke with patient. 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 Requesting an appointment after that day. Appointment rescheduled for 6/9 at 10am with Dr.Miller. Patient is agreeable to date and time.  Routing to provider for final review. Patient agreeable to disposition. Patient aware provider will review message and nurse will return call with any additional instructions or change of disposition. Will close encounter.

## 2014-09-07 NOTE — Telephone Encounter (Signed)
Left message to call Kaitlyn at [phone]. 

## 2014-09-11 ENCOUNTER — Ambulatory Visit: Payer: BLUE CROSS/BLUE SHIELD | Admitting: Obstetrics & Gynecology

## 2014-10-05 ENCOUNTER — Telehealth: Payer: Self-pay | Admitting: Obstetrics & Gynecology

## 2014-10-05 ENCOUNTER — Ambulatory Visit (INDEPENDENT_AMBULATORY_CARE_PROVIDER_SITE_OTHER): Payer: BLUE CROSS/BLUE SHIELD | Admitting: Obstetrics & Gynecology

## 2014-10-05 VITALS — BP 120/80 | HR 80 | Temp 98.6°F | Ht 63.5 in | Wt 144.0 lb

## 2014-10-05 DIAGNOSIS — Z1211 Encounter for screening for malignant neoplasm of colon: Secondary | ICD-10-CM

## 2014-10-05 DIAGNOSIS — K6289 Other specified diseases of anus and rectum: Secondary | ICD-10-CM | POA: Diagnosis not present

## 2014-10-05 DIAGNOSIS — N764 Abscess of vulva: Secondary | ICD-10-CM | POA: Diagnosis not present

## 2014-10-05 MED ORDER — CLOBETASOL PROPIONATE 0.05 % EX OINT: 1.0000 "application " | TOPICAL_OINTMENT | Freq: Two times a day (BID) | CUTANEOUS | Status: DC

## 2014-10-05 NOTE — Telephone Encounter (Signed)
error 

## 2014-10-05 NOTE — Progress Notes (Signed)
Patient scheduled for Pelvic MRI with and Without contrast for 10/06/14 at 1500. Instructions given and patient completed screening over the phone with Jearld Pies at [facility].

## 2014-10-05 NOTE — Telephone Encounter (Signed)
Yes.  This is fine.  I still have to do the peer to peer review.

## 2014-10-05 NOTE — Telephone Encounter (Signed)
Spoke to Agency at the who states the pt needs to be reschedule because she is unable to go to site chosen. Pt needs to be rescheduled and the change needs to be reflected on the authorization.

## 2014-10-05 NOTE — Progress Notes (Signed)
Subjective:     Patient ID: Brandy Parsons, female   DOB: March 31, 1971, 44 y.o.   MRN: [id number]  HPI Very nice 44 yo G2P1102 MWF here for possible biopsy of recurrent right vulvar/buttocks abscess/lesion.  Pt reports issues with this started about three years ago before she was in [place].  She had two I&D's in the office of her prior gynecologist and then had a WLE.  Within two weeks she knew that the area was not treated fully and has essentially "come right back".  Now every month before her cycle, the area becomes tender and raised and more erythematous.  She usually ends up poking it with a sterile straight pin and gets some drainage out of it.  This relieves some of the pain and then it improves.  The cycle starts all over again.    I did obtain outside records from her prior gynecologist.  No pathology report was present.  Possible hidradenitis is mentioned in his notes but this is a single isolated lesion.  Today, pt reports it is not as bad as it was two days ago.  Was significantly bigger and more tender but she drained it herself two days ago and that is always what makes it feel better.    Pt also reports some rectal irritation that she is not sure is related.  No rectal bleeding.  Would like me to look at this to see if I can give some recommendations.    Review of Systems  Constitutional: Negative for fever.  All other systems reviewed and are negative.      Objective:   Physical Exam  Constitutional: She is oriented to person, place, and time. She appears well-developed and well-nourished.  Genitourinary:     Neurological: She is alert and oriented to person, place, and time.  Skin: Skin is warm and dry.  Psychiatric: She has a normal mood and affect.       Assessment:     Right abscess, recurrent vulvar lesion.  Have questioned if possible endometriosis with recurrent nature related to menstrual cycle.  Today just appears more abscessed in nature.  Less consistent  with hidradenitis due to single, isolated location. Rectal irritation, appears most consistent with lichen simplex chronicus     Plan:     No vulvar biopsy today Skin culture pending.  Will treat based on results. Really feel like pt will need surgical excision if wants this fully resolved. So need to determine size and depth of lesions.  Plan pelvic MRI.   Consider colonoscopy.  Referral to Dr. Loreta Ave Clobetasol 0.05 % ointment bid for up to 14 days.  Typical topical irritatants like toilet paper and feminine products discussed.  Will biopsy if not fully resolved with clobetasol.       ~25 minutes spent with patient >50% of time was in face to face discussion of history, prior treatments and my recommendations for additional evaluation including MRI and colonoscopy before proceeding with any additional surgical procedure.

## 2014-10-05 NOTE — Telephone Encounter (Signed)
Patient states appointment is further than planned and wants to know if that appointment id ok because she thought Dr. Hyacinth Meeker wanted her to been seen sooner. Patient ok for call back

## 2014-10-05 NOTE — Telephone Encounter (Addendum)
Dr. Hyacinth Meeker, patient was scheduled for MRI tomorrow, however, it was changed to Monday 10/23/14 due to location change with [facility].  Okay to keep as scheduled?

## 2014-10-06 ENCOUNTER — Other Ambulatory Visit: Payer: Self-pay

## 2014-10-06 NOTE — Telephone Encounter (Signed)
Message left to return call to [place] at [phone], detailed message left to advise that Dr. Hyacinth Meeker said 10/23/14 will be okay to keep as scheduled and that we are still working on insurance prior authorization for this MRI.  Advised she can call [phone] to [facility] to see if any cancellations on the days she is available to see if she can be seen sooner. Detailed message okay per designated party release form.   If any questions return call.

## 2014-10-08 LAB — WOUND CULTURE: Gram Stain: NONE SEEN

## 2014-10-09 ENCOUNTER — Telehealth: Payer: Self-pay | Admitting: Emergency Medicine

## 2014-10-09 MED ORDER — AMOXICILLIN 500 MG PO CAPS
500.0000 mg | ORAL_CAPSULE | Freq: Three times a day (TID) | ORAL | Status: DC
Start: 2014-10-09 — End: 2014-11-17

## 2014-10-09 MED ORDER — FLUCONAZOLE 150 MG PO TABS: ORAL_TABLET | ORAL | Status: DC

## 2014-10-09 NOTE — Telephone Encounter (Signed)
Spoke with patient and message from Dr. Hyacinth Meeker given. Instructions for taking Amoxicillin and Diflucan given.   Patient verbalized understanding of results and plan of care as written by Dr. Hyacinth Meeker. She is advised to call back with any questions or concerns related to treatment and patient agreeable.   Routing to provider for final review. Patient agreeable to disposition. Will close encounter.

## 2014-10-09 NOTE — Telephone Encounter (Signed)
Message left to return call to Quaneshia Wareing at [phone].    

## 2014-10-09 NOTE — Telephone Encounter (Signed)
-----   Message from Jerene Bears, MD sent at 10/09/2014  7:29 AM EDT ----- Please inform pt the skin area showed group b strep.  Needs to be treated with antibiotics.  Amoxicillin 541m tid x 7 day.  May need diflucan 150mg  po x 1, repeat 48 hrs if needed in case gets a yeast infection with it.

## 2014-10-10 ENCOUNTER — Encounter: Payer: Self-pay | Admitting: Obstetrics & Gynecology

## 2014-10-11 NOTE — Telephone Encounter (Signed)
===  View-only below this line===  ----- Message -----    From: Jari Favre    Sent: 10/11/2014  10:51 AM      To: Joeseph Amor, RN  Received call from Malachi Bonds with [employer] [facility] regarding imaging denial.  Requests fax sent to [phone] with clinical notes/prior imaging reports in order to appeal claim denial.  Fax must be received by Monday 10/16/14. Write *appeal* on cover sheet.      Malachi Bonds: [phone] ext [phone]  Let me know if this is something i can help with.  Thanks, [employer]

## 2014-10-11 NOTE — Telephone Encounter (Signed)
Dr. Loreta Ave asked to send the info for the denial to her as well.  She will call.  She thinks pt has Crohn's and a fistula.

## 2014-10-11 NOTE — Telephone Encounter (Signed)
Left message for Brandy Parsons at Dr. Kenna Gilbert office with appeal information as below.

## 2014-10-12 NOTE — Telephone Encounter (Signed)
Called and spoke with Lupita Leash at Dr. Kenna Gilbert office. She confirms that Dr. Loreta Ave is placing a new order for MRI and they will start authorization process separately from our office.

## 2014-10-13 ENCOUNTER — Other Ambulatory Visit: Payer: Self-pay | Admitting: Gastroenterology

## 2014-10-13 DIAGNOSIS — K625 Hemorrhage of anus and rectum: Secondary | ICD-10-CM

## 2014-10-13 DIAGNOSIS — K612 Anorectal abscess: Secondary | ICD-10-CM

## 2014-10-16 NOTE — Telephone Encounter (Signed)
Dr. Kenna Gilbert office has replaced order and obtained authorization. MRI has been scheduled for patient.  Will close encounter.

## 2014-10-23 ENCOUNTER — Ambulatory Visit
Admission: RE | Admit: 2014-10-23 | Discharge: 2014-10-23 | Disposition: A | Discharge status: Home / Self Care | Payer: BLUE CROSS/BLUE SHIELD | Source: Ambulatory Visit | Attending: Gastroenterology | Admitting: Gastroenterology

## 2014-10-23 ENCOUNTER — Other Ambulatory Visit: Payer: Self-pay

## 2014-10-23 DIAGNOSIS — K625 Hemorrhage of anus and rectum: Secondary | ICD-10-CM

## 2014-10-23 DIAGNOSIS — K612 Anorectal abscess: Secondary | ICD-10-CM

## 2014-10-23 MED ORDER — GADOBENATE DIMEGLUMINE 529 MG/ML IV SOLN
13.0000 mL | Freq: Once | INTRAVENOUS | Status: AC | PRN
  Administered 2014-10-23: 13 mL via INTRAVENOUS

## 2014-10-26 ENCOUNTER — Telehealth: Payer: Self-pay | Admitting: Obstetrics & Gynecology

## 2014-10-26 NOTE — Telephone Encounter (Signed)
Patient calling for MRI results.

## 2014-10-26 NOTE — Telephone Encounter (Signed)
Call to patient and message from Dr. Hyacinth MeekerMiller given. Patient verbalized understanding.  She will follow up with Dr. Loreta AveMann.  Copy of report faxed to Dr. Loreta AveMann with fax confirmation received.  Routing to provider for final review. Patient agreeable to disposition. Will close encounter.

## 2014-10-26 NOTE — Telephone Encounter (Signed)
-----   Message from Jerene BearsMary S Miller, MD sent at 10/25/2014  3:16 PM EDT ----- Please inform pt the MRI does not show a perianal fistula.  Pt is going to have colonoscopy so will wait until this is done to make additional recommendations.  Please make sure Dr. Kenna GilbertMann's office gets a copy.  She is out of town this week with her mother so pt may not have gotten results yet.  THanks.

## 2014-11-03 ENCOUNTER — Ambulatory Visit
Admission: RE | Admit: 2014-11-03 | Discharge: 2014-11-03 | Disposition: A | Discharge status: Home / Self Care | Payer: BLUE CROSS/BLUE SHIELD | Source: Ambulatory Visit

## 2014-11-03 DIAGNOSIS — Z1231 Encounter for screening mammogram for malignant neoplasm of breast: Secondary | ICD-10-CM

## 2014-11-06 ENCOUNTER — Telehealth: Payer: Self-pay | Admitting: *Deleted

## 2014-11-06 NOTE — Telephone Encounter (Signed)
Dr Loreta AveMann calling to advise that colonoscopy was performed today. "Nothing much on coloscopy." No fistula noted. Ok to proceed with referral, patient reported she had not heard from our office about appointment.   Dr Loreta AveMann cell [phone].

## 2014-11-07 NOTE — Telephone Encounter (Signed)
Patient is calling regarding her recent colonoscopy and MMG results. Patient thinks she may need "another surgery". Patient is anxious to talk with someone from our office today. Last seen 05/30/2014.

## 2014-11-07 NOTE — Telephone Encounter (Signed)
Return call to patient. Patient with very sharp tone of voice. States she has had "all these tests done and has yet to hear from Dr Hyacinth MeekerMiller." Feels "very disconnected."  Has had MMG, MRI and colonoscopy. Advised MMG, done 11-03-14 was WNL and usually this result is communicated by the facility. MRI result and colonoscopy were reviewed with patient by Dr Loreta AveMann. Patient states colonoscopy report says "to SPEAK to Dr Hyacinth MeekerMiller ASAP" and that is what she expects. She states she is under the impression from Dr Loreta AveMann that she will [facility] and she needs to facilitate this. 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 States she doesn't "want two weeks to pass before we contact her which is frequently the case."   Advised colonoscopy report has been received and Dr Hyacinth MeekerMiller will review and we will call her back. Advised may need office visit if desires to speak directly to dr Hyacinth MeekerMiller.

## 2014-11-07 NOTE — Telephone Encounter (Signed)
Routing to Billie RuddySally Yeakley, RN and Dr.Miller for review and advise.

## 2014-11-08 NOTE — Telephone Encounter (Signed)
Called pt personally.  Just received negative colonoscopy report today.  Advised pt I really feel area of chronic infection in groin needs excision.  Pt really only wants this done one additional time and hopefully it will be "fixed".  H/o chronic, recurring abscess in groin that coincides with menstrual cycle.  I still question whether there is endometriosis causing this.  Feel due to depth of dissection that will be needed, will refer to Dr. Andrey Farmerossi, gyn/onc.  Pt aware I am not concerned about malignancy but want her to see surgeon who is comfortable with the depth of where excision may need to be performed.  Pt comfortable with plan.  Spoke with Billie RuddySally Yeakley, RN, who will make referral appt will be made for pt.  Hopefully will be able to have appt time within next week or two.

## 2014-11-08 NOTE — Telephone Encounter (Signed)
Call to patient. Advised of appointment scheduled with Dr [facility] at [facility] for 11-17-14 at 1030, instructed to arrive at 10 to register and to expect pelvic exam.  Encounter closed.

## 2014-11-16 NOTE — Telephone Encounter (Signed)
Have spoken personally with Dr. Andrey Farmer regarding findings and referral.

## 2014-11-17 ENCOUNTER — Encounter: Payer: Self-pay | Admitting: Gynecologic Oncology

## 2014-11-17 ENCOUNTER — Ambulatory Visit: Payer: BLUE CROSS/BLUE SHIELD | Attending: Gynecologic Oncology | Admitting: Gynecologic Oncology

## 2014-11-17 VITALS — BP 138/74 | HR 65 | Temp 98.2°F | Resp 18 | Ht 63.5 in | Wt 144.8 lb

## 2014-11-17 DIAGNOSIS — N764 Abscess of vulva: Secondary | ICD-10-CM | POA: Diagnosis not present

## 2014-11-17 NOTE — Progress Notes (Signed)
Consult Note: Gyn-Onc  Consult was requested by Dr. Hyacinth Meeker for the evaluation of Brandy Parsons 44 y.o. female for recurrent vulvar mass/granulation tissue  CC:  Chief Complaint  Patient presents with  . vulvar abscess    New consult    Assessment/Plan:  Brandy Parsons  is a 44 y.o.  year old with persistent symptomatic granulation tissue at the right posterior labia majora. The patient desires definitive therapy with resection. I'm recommending a wide local excision of the right posterior labia majora. I discussed anticipated perioperative recovery and risk of falling separation or breakdown. I discussed anticipated postoperative care of the area and restrictions.   HPI: Brandy Parsons is a very pleasant 44 year old woman with a history of recurrent right vulva abscess lesion on the posterior labia majora she is seen in consultation at the request of Dr. Hyacinth Meeker for this. The patient reports that about 3 years ago she developed a right posterior vulvar abscess that underwent incision and drainage by her private gynecologist. There was persistent granulation tissue at the area and then she underwent a wide local excision but within a couple of weeks the lesion had returned. She had continued to have expected management since that time. She has had a subsequent biopsy of the lesion that showed granulation tissue only.   She notices irritation in the area on a somewhat cyclical basis that is worse days before her period. She's had a colonoscopy within the last year that was unremarkable for a communicating sinus tract to the lesion.  MRI of the pelvis performed on 10/23/2014 revealed a grossly normal appearing uterus, an area of mild asymmetry in the inferior aspect of the right external genitalia with no discernible mass fluid collection. There is a tubular tract extending from this region to the skin surface with no clear connection between the track and anus or fluid  collection.  Current Meds:  Outpatient Encounter Prescriptions as of 11/17/2014  Medication Sig  . acetaminophen (TYLENOL) 325 MG tablet Take 650 mg by mouth as needed.  . Multiple Vitamin (MULTIVITAMIN) tablet Take 1 tablet by mouth daily.  . norethindrone-ethinyl estradiol (JUNEL FE,GILDESS FE,LOESTRIN FE) 1-20 MG-MCG tablet Take 1 tablet by mouth daily.  . [DISCONTINUED] amoxicillin (AMOXIL) 500 MG capsule Take 1 capsule (500 mg total) by mouth 3 (three) times daily.  . [DISCONTINUED] clobetasol ointment (TEMOVATE) 0.05 % Apply 1 application topically 2 (two) times daily. Apply as directed twice daily  . [DISCONTINUED] fluconazole (DIFLUCAN) 150 MG tablet Take one tablet for yeast infection symptoms and repeat in 48 hours if symptoms remain.   No facility-administered encounter medications on file as of 11/17/2014.    Allergy: No Known Allergies  Social Hx:   History   Social History  . Marital Status: Married    Spouse Name: N/A  . Number of Children: N/A  . Years of Education: N/A   Occupational History  . Not on file.   Social History Main Topics  . Smoking status: Never Smoker   . Smokeless tobacco: Never Used  . Alcohol Use: Yes     Comment: wine/beer occasionally  . Drug Use: No  . Sexual Activity:    Partners: Male    Birth Control/ Protection: Pill   Other Topics Concern  . Not on file   Social History Narrative    Past Surgical Hx:  Past Surgical History  Procedure Laterality Date  . Cesarean section  06-25-03  . Wisdom tooth extraction  early 20's  . Varicose vein  surgery Left     Past Medical Hx:  Past Medical History  Diagnosis Date  . Chicken pox as a child  . Pre-eclampsia at 26 weeks  . Previous pregnancy with HELLP syndrome, antepartum 08/04/2012  . Varicose veins 08/04/2012  . Other and unspecified hyperlipidemia 08/04/2012  . Preventative health care 08/04/2012  . DVT (deep venous thrombosis)     right after C section  . Vulvar abscess  08/04/2012    Past Gynecological History:  SVD x 2  No LMP recorded.  Family Hx:  Family History  Problem Relation Age of Onset  . Dementia Maternal Grandmother     late age  . Alzheimer's disease Maternal Grandfather     Review of Systems:  Constitutional  Feels well,    ENT Normal appearing ears and nares bilaterally Skin/Breast  No rash, sores, jaundice, itching, dryness Cardiovascular  No chest pain, shortness of breath, or edema  Pulmonary  No cough or wheeze.  Gastro Intestinal  No nausea, vomitting, or diarrhoea. No bright red blood per rectum, no abdominal pain, change in bowel movement, or constipation.  Genito Urinary  No frequency, urgency, dysuria, see HPI Musculo Skeletal  No myalgia, arthralgia, joint swelling or pain  Neurologic  No weakness, numbness, change in gait,  Psychology  No depression, anxiety, insomnia.   Vitals:  Blood pressure 138/74, pulse 65, temperature 98.2 F (36.8 C), temperature source Oral, resp. rate 18, height 5' 3.5" (1.613 m), weight 144 lb 12.8 oz (65.681 kg), SpO2 100 %.  Physical Exam: WD in NAD Neck  Supple NROM, without any enlargements.  Lymph Node Survey No cervical supraclavicular or inguinal adenopathy Cardiovascular  Pulse normal rate, regularity and rhythm. S1 and S2 normal.  Lungs  Clear to auscultation bilateraly, without wheezes/crackles/rhonchi. Good air movement.  Skin  No rash/lesions/breakdown  Psychiatry  Alert and oriented to person, place, and time  Abdomen  Normoactive bowel sounds, abdomen soft, non-tender and overweight without evidence of hernia.  Back No CVA tenderness Genito Urinary  Vulva/vagina: There is a 1 cm erythematous raised area of granulation tissue on the posterior right labia majora. It is not fixed to the underlying anal sphincter. It is not communicating with the perivaginal tissues and is not Bartholin's gland duct cyst. It is tender.   Bladder/urethra:  No lesions or masses,  well supported bladder  Vagina: Normal. Rectal  Good tone, no masses no cul de sac nodularity.  Extremities  No bilateral cyanosis, clubbing or edema.   Quinn Axe, MD   11/17/2014, 5:16 PM

## 2014-11-17 NOTE — Patient Instructions (Signed)
Please call when you are ready to schedule surgery with Dr. Andrey Farmer.  Please call for any questions or concerns.

## 2015-05-02 ENCOUNTER — Other Ambulatory Visit: Payer: Self-pay | Admitting: Nurse Practitioner

## 2015-05-02 MED ORDER — NORETHIN ACE-ETH ESTRAD-FE 1-20 MG-MCG PO TABS: 1.0000 | ORAL_TABLET | Freq: Every day | ORAL | Status: DC

## 2015-05-02 NOTE — Telephone Encounter (Signed)
Medication refill request: Loestrin fe 1-20 Last AEX:  06-09-14 Next AEX: 06-05-15 Last MMG (if hormonal medication request): 11-03-14 category c density,birads 1:neg Refill authorized: please approve 3mth supply with 0 refills.

## 2015-05-02 NOTE — Telephone Encounter (Signed)
Pt notified that rx was sent to pharmacy

## 2015-05-02 NOTE — Telephone Encounter (Signed)
Brandy Parsons , [place], [place] [phone]. Patient is asking for refills of her birth control until her next aex appointment 06/05/15 @ 11:00am with Lorelle FormosaPatty Grunn, FNP

## 2015-06-01 ENCOUNTER — Ambulatory Visit: Payer: BLUE CROSS/BLUE SHIELD | Admitting: Nurse Practitioner

## 2015-06-05 ENCOUNTER — Ambulatory Visit: Payer: BLUE CROSS/BLUE SHIELD | Admitting: Nurse Practitioner

## 2015-06-20 ENCOUNTER — Other Ambulatory Visit: Payer: Self-pay | Admitting: Nurse Practitioner

## 2015-06-20 MED ORDER — NORETHIN ACE-ETH ESTRAD-FE 1-20 MG-MCG PO TABS: 1.0000 | ORAL_TABLET | Freq: Every day | ORAL | Status: DC

## 2015-06-20 NOTE — Telephone Encounter (Signed)
Medication refill request: Junel Last AEX:  05-30-14 Next AEX: 06-26-15 Last MMG (if hormonal medication request): 11-03-14 Refill authorized: please advise  Sending to  JJ since PG is out of the office

## 2015-06-20 NOTE — Telephone Encounter (Signed)
[facility] [place] [place]. Best # to reach: [phone]

## 2015-06-23 ENCOUNTER — Other Ambulatory Visit: Payer: Self-pay | Admitting: Nurse Practitioner

## 2015-06-25 NOTE — Telephone Encounter (Signed)
Medication refill request: Junel Last AEX: 05/30/2014 PG Next AEX: 06/26/15 PG Last MMG (if hormonal medication request): NA Refill authorized: 06/20/15 2 Package 0 Refills  Today: Refused: Refill already handled

## 2015-06-26 ENCOUNTER — Ambulatory Visit: Payer: BLUE CROSS/BLUE SHIELD | Admitting: Nurse Practitioner

## 2015-06-28 ENCOUNTER — Encounter: Payer: Self-pay | Admitting: Obstetrics & Gynecology

## 2015-06-28 ENCOUNTER — Ambulatory Visit (INDEPENDENT_AMBULATORY_CARE_PROVIDER_SITE_OTHER): Payer: BLUE CROSS/BLUE SHIELD | Admitting: Obstetrics & Gynecology

## 2015-06-28 VITALS — BP 106/70 | HR 84 | Resp 14 | Ht 63.5 in | Wt 144.0 lb

## 2015-06-28 DIAGNOSIS — Z01419 Encounter for gynecological examination (general) (routine) without abnormal findings: Secondary | ICD-10-CM

## 2015-06-28 DIAGNOSIS — N764 Abscess of vulva: Secondary | ICD-10-CM

## 2015-06-28 DIAGNOSIS — Z0000 Encounter for general adult medical examination without abnormal findings: Secondary | ICD-10-CM | POA: Diagnosis not present

## 2015-06-28 LAB — POCT URINALYSIS DIPSTICK
BILIRUBIN UA: NEGATIVE
Glucose, UA: NEGATIVE
Ketones, UA: NEGATIVE
Nitrite, UA: NEGATIVE
PH UA: 5
PROTEIN UA: NEGATIVE
Urobilinogen, UA: NEGATIVE

## 2015-06-28 MED ORDER — SULFAMETHOXAZOLE-TRIMETHOPRIM 800-160 MG PO TABS: 1.0000 | ORAL_TABLET | Freq: Two times a day (BID) | ORAL | Status: DC

## 2015-06-28 MED ORDER — NORETHIN ACE-ETH ESTRAD-FE 1-20 MG-MCG PO TABS: 1.0000 | ORAL_TABLET | Freq: Every day | ORAL | Status: DC

## 2015-06-28 NOTE — Progress Notes (Signed)
45 y.o. [id number] MarriedCaucasianF here for annual exam.  Pt has chronic vulvar abscess.  It is there chronically but worsens before her cycle.  She typically uses a needle and drains the chronic abscess.  Reports there is a new area today and she wants me to look at this.  Pt did have an MRI last year and a colonoscopy that showed some polyps.  She saw Dr. Andrey Farmer last year.  Ended up not during surgery due to selling home and moving to [place].  Husband is still commuting to [employer].  Oldest son is a Printmaker in [employer].  He is so bored with school he is attending.  So, considering a move back.   Cycles are regular.  Flow lasts three days.  On OCPs.  H/O post partum DVT.    Patient's last menstrual period was 06/18/2015.          Sexually active: Yes.    The current method of family planning is OCP (estrogen/progesterone).    Exercising: Yes.    Gym 3 x weekly Smoker:  no  Health Maintenance: Pap:  05/30/14 Neg. HR HPV:neg History of abnormal Pap:  no MMG: 11/03/14 BIRADS1:Neg Colonoscopy:  11/06/14 Repeat at age 38.  BMD:   Never TDaP:  04/2010  Screening Labs: Not today, Urine today: RBC=Mod(++), WBC=Trace   reports that she has never smoked. She has never used smokeless tobacco. She reports that she drinks alcohol. She reports that she does not use illicit drugs.  Past Medical History  Diagnosis Date  . Chicken pox as a child  . Pre-eclampsia at 26 weeks  . Previous pregnancy with HELLP syndrome, antepartum 08/04/2012  . Varicose veins 08/04/2012  . Other and unspecified hyperlipidemia 08/04/2012  . Preventative health care 08/04/2012  . DVT (deep venous thrombosis) (HCC)     right after C section  . Vulvar abscess 08/04/2012    Past Surgical History  Procedure Laterality Date  . Cesarean section  06-25-03  . Wisdom tooth extraction  early 20's  . Varicose vein surgery Left     Current Outpatient Prescriptions  Medication Sig Dispense Refill  . acetaminophen (TYLENOL) 325 MG  tablet Take 650 mg by mouth as needed.    . Multiple Vitamin (MULTIVITAMIN) tablet Take 1 tablet by mouth daily.    . norethindrone-ethinyl estradiol (JUNEL FE,GILDESS FE,LOESTRIN FE) 1-20 MG-MCG tablet Take 1 tablet by mouth daily. 2 Package 0  . Omega-3 Fatty Acids (FISH OIL CONCENTRATE) 300 MG CAPS Take by mouth.     No current facility-administered medications for this visit.    Family History  Problem Relation Age of Onset  . Dementia Maternal Grandmother     late age  . Alzheimer's disease Maternal Grandfather     ROS:  Pertinent items are noted in HPI.  Otherwise, a comprehensive ROS was negative.  Exam:   BP 106/70 mmHg  Pulse 84  Resp 14  Ht 5' 3.5" (1.613 m)  Wt 144 lb (65.318 kg)  BMI 25.11 kg/m2  LMP 06/18/2015  Weight change: -3#  Height: 5' 3.5" (161.3 cm)  Ht Readings from Last 3 Encounters:  06/28/15 5' 3.5" (1.613 m)  11/17/14 5' 3.5" (1.613 m)  10/05/14 5' 3.5" (1.613 m)    General appearance: alert, cooperative and appears stated age Head: Normocephalic, without obvious abnormality, atraumatic Neck: no adenopathy, supple, symmetrical, trachea midline and thyroid normal to inspection and palpation Lungs: clear to auscultation bilaterally Breasts: normal appearance, no masses or tenderness Heart:  regular rate and rhythm Abdomen: soft, non-tender; bowel sounds normal; no masses,  no organomegaly Extremities: extremities normal, atraumatic, no cyanosis or edema Skin: Skin color, texture, turgor normal. No rashes or lesions Lymph nodes: Cervical, supraclavicular, and axillary nodes normal. No abnormal inguinal nodes palpated Neurologic: Grossly normal   Pelvic: External genitalia:  Chronic abscess noted to right inferior aspect of vulva.  Also, 2cm left labium minora abscess noted.  This is draining.  Culture obtained.              Urethra:  normal appearing urethra with no masses, tenderness or lesions              Bartholins and Skenes: normal                  Vagina: normal appearing vagina with normal color and discharge, no lesions              Cervix: no lesions              Pap taken: No. Bimanual Exam:  Uterus:  normal size, contour, position, consistency, mobility, non-tender              Adnexa: normal adnexa and no mass, fullness, tenderness               Rectovaginal: Confirms               Anus:  normal sphincter tone, no lesions  Chaperone was present for exam.  A:  Well Woman with normal exam Recurrent right vulvar abscess, had MRI last year.  Saw gyn/onc.  Encouraged pt to make decision about excision as issue is still present. New left labium abscess, draining today Remote history of DVT after C-Section 11 years ago.  Saw Dr. Myna Hidalgo (hem/onc) for consultation who was ok with pt continuing on OCPs.  P: Mammogram guidelines reviewed with pt. Pap with neg HR HPV 2/16.  No pap today Rx for loestrin 1/20 to pharmacy for entire year Bactrim DS bid x 7 days.  Wound culture pending CMP, lipids, TSH, Vit D, CBC pending Return annually or prn

## 2015-06-30 LAB — WOUND CULTURE
Gram Stain: NONE SEEN
Organism ID, Bacteria: NORMAL

## 2015-07-03 ENCOUNTER — Telehealth: Payer: Self-pay | Admitting: *Deleted

## 2015-07-03 NOTE — Telephone Encounter (Signed)
Notes Recorded by Dion Bodyeina C Beltran, CMA on 07/03/2015 at 9:26 AM LM for pt to call back. Notes Recorded by Jerene BearsMary S Miller, MD on 07/01/2015 at 9:29 PM Please call pt and let her know the culture did not show any specific bacteria. There was no MRSA. She should finish the antibiotics and of course let me know if this happens again. Hopefully, this is just a one time occurrence. Also, I hope she will plan to follow-up with Dr. Andrey Farmerossi. If she needs any help with an appointment, please have her call. Thanks.

## 2015-07-03 NOTE — Telephone Encounter (Signed)
Pt notified. Verbalized understanding. She states she will set up appt with Dr. Andrey Farmerossi.

## 2016-07-03 ENCOUNTER — Other Ambulatory Visit: Payer: Self-pay | Admitting: Obstetrics & Gynecology

## 2016-07-03 DIAGNOSIS — Z1231 Encounter for screening mammogram for malignant neoplasm of breast: Secondary | ICD-10-CM

## 2016-08-25 ENCOUNTER — Other Ambulatory Visit: Payer: Self-pay | Admitting: Obstetrics & Gynecology

## 2016-08-25 NOTE — Telephone Encounter (Signed)
Medication refill request: junel fe  Last AEX:  3/2/217 SM Next AEX: 10/10/16 SM Last MMG (if hormonal medication request): 11/03/14 BIRADS1:neg. Has appt 10/10/16  Refill authorized: 06/28/15 #[id number]/[id number]. Today please advise.

## 2016-10-10 ENCOUNTER — Ambulatory Visit: Payer: BLUE CROSS/BLUE SHIELD

## 2016-10-10 ENCOUNTER — Ambulatory Visit: Payer: BLUE CROSS/BLUE SHIELD | Admitting: Obstetrics & Gynecology

## 2016-11-14 ENCOUNTER — Other Ambulatory Visit: Payer: Self-pay | Admitting: Obstetrics & Gynecology

## 2016-11-14 NOTE — Telephone Encounter (Signed)
Medication refill request: Junel FE  Last AEX:  06/28/15 MSM Next AEX: 01/02/17  Last MMG (if hormonal medication request): 11/03/14 BIRADS 1 negative  Refill authorized: 08/25/16. #[id number], 0RF. Please advise. Thank you.   Routing to Dr. Edward JollySilva covering for Dr. Hyacinth MeekerMiller.

## 2016-11-14 NOTE — Telephone Encounter (Signed)
Left message per DPR of message as seen below from Dr. Edward JollySilva. Instructed patient to return call with any questions/concerns about waiting until Monday for Dr. Hyacinth MeekerMiller to review.

## 2016-11-14 NOTE — Telephone Encounter (Signed)
Patient has had a DVT post partum and a negative work up. She saw Heme Onc, Dr. Myna HidalgoEnnever, who approved her use of combined OCPs and recommended daily ASA. I would like for Dr. Hyacinth MeekerMiller to refill this Rx if the patient can wait until 11/17/16.  Cc- Dr. Hyacinth MeekerMiller

## 2017-01-02 ENCOUNTER — Ambulatory Visit: Payer: BLUE CROSS/BLUE SHIELD

## 2017-01-02 ENCOUNTER — Ambulatory Visit: Payer: BLUE CROSS/BLUE SHIELD | Admitting: Obstetrics & Gynecology

## 2017-03-09 ENCOUNTER — Ambulatory Visit: Payer: BLUE CROSS/BLUE SHIELD

## 2017-03-09 ENCOUNTER — Ambulatory Visit: Payer: BLUE CROSS/BLUE SHIELD | Admitting: Obstetrics & Gynecology

## 2017-03-09 ENCOUNTER — Telehealth: Payer: Self-pay | Admitting: *Deleted

## 2017-03-09 NOTE — Telephone Encounter (Signed)
Returned call to patient. 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 Appointment rescheduled to Thursday 04/30/17 at 1345. Patient agreeable to date and time of appointment.   Encounter closed.

## 2017-03-09 NOTE — Telephone Encounter (Signed)
Patient returning your call.

## 2017-03-09 NOTE — Telephone Encounter (Signed)
Detailed message left per DPR asking patient to return call to reschedule aex due to provider delayed in surgery.

## 2017-04-29 ENCOUNTER — Other Ambulatory Visit (HOSPITAL_COMMUNITY)
Admission: RE | Admit: 2017-04-29 | Discharge: 2017-04-29 | Disposition: A | Discharge status: Home / Self Care | Payer: BLUE CROSS/BLUE SHIELD | Source: Ambulatory Visit | Attending: Obstetrics & Gynecology | Admitting: Obstetrics & Gynecology

## 2017-04-29 ENCOUNTER — Ambulatory Visit
Admission: RE | Admit: 2017-04-29 | Discharge: 2017-04-29 | Disposition: A | Discharge status: Home / Self Care | Payer: BLUE CROSS/BLUE SHIELD | Source: Ambulatory Visit | Attending: Obstetrics & Gynecology | Admitting: Obstetrics & Gynecology

## 2017-04-29 ENCOUNTER — Other Ambulatory Visit: Payer: Self-pay

## 2017-04-29 ENCOUNTER — Ambulatory Visit: Payer: BLUE CROSS/BLUE SHIELD | Admitting: Obstetrics & Gynecology

## 2017-04-29 ENCOUNTER — Encounter: Payer: Self-pay | Admitting: Obstetrics & Gynecology

## 2017-04-29 VITALS — BP 120/80 | HR 66 | Resp 14 | Ht 63.25 in | Wt 148.5 lb

## 2017-04-29 DIAGNOSIS — Z1231 Encounter for screening mammogram for malignant neoplasm of breast: Secondary | ICD-10-CM

## 2017-04-29 DIAGNOSIS — Z01419 Encounter for gynecological examination (general) (routine) without abnormal findings: Secondary | ICD-10-CM | POA: Diagnosis not present

## 2017-04-29 DIAGNOSIS — Z0000 Encounter for general adult medical examination without abnormal findings: Secondary | ICD-10-CM | POA: Diagnosis not present

## 2017-04-29 DIAGNOSIS — Z124 Encounter for screening for malignant neoplasm of cervix: Secondary | ICD-10-CM | POA: Diagnosis not present

## 2017-04-29 MED ORDER — NORETHINDRONE 0.35 MG PO TABS: 1.0000 | ORAL_TABLET | Freq: Every day | ORAL | 4 refills | Status: DC

## 2017-04-29 NOTE — Progress Notes (Signed)
47 y.o. [id number] Married Caucasian F here for annual exam.  Lives in [place] and then moved [place].  Moved to [place] a few months but then moved back to [place].  Husband is working in a [place] office now.  They are really happy there now.  Having chronic issues with chronic vulvar abscesses.  Reports this is less bothersome and less frequent.  "Deals with this" and does not really want to have surgery.  Did see Dr. Andrey Farmer.  The moves and work changes made scheduling more difficult.  Cycles are regular.    Patient's last menstrual period was 04/15/2017.          Sexually active: Yes.    The current method of family planning is OCP (estrogen/progesterone).    Exercising: No.  The patient does not participate in regular exercise at present. Smoker:  no  Health Maintenance: Pap:  05/30/14 negative, HR HPV negative  History of abnormal Pap:  no MMG:  04/29/17 [facility] Colonoscopy: 11/06/14 repeat age 25.  Hyperplastic polyps noted.  Pathology in chart.   BMD:   never TDaP:  04/28/10 Pneumonia vaccine(s):  never Shingrix:   never Hep C testing: not indicated  Screening Labs: plans to establish with PCP, Hb today: same, Urine today: not collected    reports that  has never smoked. she has never used smokeless tobacco. She reports that she drinks alcohol. She reports that she does not use drugs.  Past Medical History:  Diagnosis Date  . Chicken pox as a child  . DVT (deep venous thrombosis) (HCC)    right after C section  . Other and unspecified hyperlipidemia 08/04/2012  . Pre-eclampsia at 26 weeks  . Preventative health care 08/04/2012  . Previous pregnancy with HELLP syndrome, antepartum 08/04/2012  . Varicose veins 08/04/2012  . Vulvar abscess 08/04/2012    Past Surgical History:  Procedure Laterality Date  . CESAREAN SECTION  06-25-03  . VARICOSE VEIN SURGERY Left   . WISDOM TOOTH EXTRACTION  early 20's    Current Outpatient Medications  Medication Sig Dispense Refill  . JUNEL FE  1/20 1-20 MG-MCG tablet TAKE 1 TABLET BY MOUTH EVERY DAY 84 tablet 1  . Multiple Vitamin (MULTIVITAMIN) tablet Take 1 tablet by mouth daily.    . Omega-3 Fatty Acids (FISH OIL CONCENTRATE) 300 MG CAPS Take by mouth.     No current facility-administered medications for this visit.     Family History  Problem Relation Age of Onset  . Dementia Maternal Grandmother        late age  . Alzheimer's disease Maternal Grandfather     ROS:  Pertinent items are noted in HPI.  Otherwise, a comprehensive ROS was negative.  Exam:   BP 120/80 (BP Location: Right Arm, Patient Position: Sitting, Cuff Size: Normal)   Pulse 66   Resp 14   Ht 5' 3.25" (1.607 m)   Wt 148 lb 8 oz (67.4 kg)   LMP 04/15/2017   BMI 26.10 kg/m     Height: 5' 3.25" (160.7 cm)  Ht Readings from Last 3 Encounters:  04/29/17 5' 3.25" (1.607 m)  06/28/15 5' 3.5" (1.613 m)  11/17/14 5' 3.5" (1.613 m)    General appearance: alert, cooperative and appears stated age Head: Normocephalic, without obvious abnormality, atraumatic Neck: no adenopathy, supple, symmetrical, trachea midline and thyroid normal to inspection and palpation Lungs: clear to auscultation bilaterally Breasts: normal appearance, no masses or tenderness Heart: regular rate and rhythm Abdomen: soft, non-tender; bowel  sounds normal; no masses,  no organomegaly Extremities: extremities normal, atraumatic, no cyanosis or edema Skin: Skin color, texture, turgor normal. No rashes or lesions Lymph nodes: Cervical, supraclavicular, and axillary nodes normal. No abnormal inguinal nodes palpated Neurologic: Grossly normal   Pelvic: External genitalia:  no lesions              Urethra:  normal appearing urethra with no masses, tenderness or lesions              Bartholins and Skenes: normal                 Vagina: normal appearing vagina with normal color and discharge, no lesions              Cervix: no lesions              Pap taken: Yes.   Bimanual Exam:   Uterus:  normal size, contour, position, consistency, mobility, non-tender              Adnexa: normal adnexa and no mass, fullness, tenderness               Rectovaginal: Confirms               Anus:  normal sphincter tone, no lesions  Chaperone was present for exam.  A:  Well Woman with normal exam Recurrent right vulvar abscess.  Had MRI and saw gyn/onc.  Never scheduled surgery. Remote hsitory fo DVT after C-section 13 years ago.  Saw Dr. Myna HidalgoEnnever (hem/onc) for consultation who ok'ed pt continuing OCPs.      P:   Mammogram guidelines reviewed.  Just did today. pap smear and HR HPV obtained today Declines seeing Dr. Andrey Farmerossi or any other surgeon at this time Lab orders given for CBC, CMP, Lipids, TSH, Vit D.  Pt will do fasting closer to home.  Lab collect labs entered and released.  return annually or prn

## 2017-04-30 ENCOUNTER — Ambulatory Visit: Payer: Self-pay | Admitting: Obstetrics & Gynecology

## 2017-04-30 ENCOUNTER — Ambulatory Visit: Payer: BLUE CROSS/BLUE SHIELD

## 2017-05-01 LAB — CYTOLOGY - PAP
DIAGNOSIS: NEGATIVE
HPV (WINDOPATH): NOT DETECTED

## 2017-05-15 ENCOUNTER — Other Ambulatory Visit: Payer: Self-pay | Admitting: Obstetrics & Gynecology

## 2017-06-01 ENCOUNTER — Telehealth: Payer: Self-pay | Admitting: Obstetrics & Gynecology

## 2017-06-01 NOTE — Telephone Encounter (Signed)
Left message to call Mitra Duling at [phone].  

## 2017-06-01 NOTE — Telephone Encounter (Signed)
Patient was put on a new birth control and would like to switch back to old one.

## 2017-06-05 NOTE — Telephone Encounter (Signed)
Left message to call Matheau Orona at [phone].  

## 2017-06-05 NOTE — Telephone Encounter (Signed)
Patient returned call to nurse

## 2017-06-09 NOTE — Telephone Encounter (Signed)
Spoke with patient. Patient states she switched from OCP to POP one month ago and is requesting to go back to OCP Junel Fe.   Patient reports breast tenderness for 3 weeks and increased increased acne. Is currently on menses, requesting refill of Junel to start after cycle.   Advised patient will review with Dr. Hyacinth MeekerMiller and return call, patient is agreeable, request detailed message if no answer.   Dr. Hyacinth MeekerMiller -please advise on OCP refill?

## 2017-06-11 ENCOUNTER — Other Ambulatory Visit: Payer: Self-pay | Admitting: Obstetrics & Gynecology

## 2017-06-11 ENCOUNTER — Other Ambulatory Visit: Payer: Self-pay | Admitting: Obstetrics and Gynecology

## 2017-06-11 ENCOUNTER — Other Ambulatory Visit: Payer: Self-pay

## 2017-06-11 NOTE — Telephone Encounter (Signed)
Left message to call Praise Stennett at [phone].  

## 2017-06-11 NOTE — Telephone Encounter (Signed)
OK to refill with Junel FE 1/20.  Ok to send in 3 month supply with RF for the year.  [facility] [place], [place], where she [place] on file for her.  Please clarify which pharmacy.  Thanks.

## 2017-06-12 MED ORDER — NORETHIN ACE-ETH ESTRAD-FE 1-20 MG-MCG PO TABS: 1.0000 | ORAL_TABLET | Freq: Every day | ORAL | 3 refills | Status: DC

## 2017-06-12 NOTE — Telephone Encounter (Signed)
Spoke with [facility], [facility], Rx for B[employer], [employer] Company[employer]el Fe sent. Patient verbalizes understanding and is agreeable.   Routing to provider for final review. Patient is agreeable to disposition. Will close encounter.

## 2017-09-05 DIAGNOSIS — L237 Allergic contact dermatitis due to plants, except food: Secondary | ICD-10-CM | POA: Diagnosis not present

## 2018-01-12 DIAGNOSIS — I83811 Varicose veins of right lower extremities with pain: Secondary | ICD-10-CM | POA: Diagnosis not present

## 2018-01-12 DIAGNOSIS — I83891 Varicose veins of right lower extremities with other complications: Secondary | ICD-10-CM | POA: Diagnosis not present

## 2018-03-12 DIAGNOSIS — Z23 Encounter for immunization: Secondary | ICD-10-CM | POA: Diagnosis not present

## 2018-04-30 ENCOUNTER — Other Ambulatory Visit: Payer: Self-pay | Admitting: Obstetrics & Gynecology

## 2018-04-30 DIAGNOSIS — Z1231 Encounter for screening mammogram for malignant neoplasm of breast: Secondary | ICD-10-CM

## 2018-05-06 ENCOUNTER — Other Ambulatory Visit: Payer: Self-pay | Admitting: Obstetrics & Gynecology

## 2018-05-06 MED ORDER — NORETHIN ACE-ETH ESTRAD-FE 1-20 MG-MCG PO TABS: 1.0000 | ORAL_TABLET | Freq: Every day | ORAL | 0 refills | Status: DC

## 2018-05-06 NOTE — Telephone Encounter (Signed)
Patient is calling requesting a refill request for Junel Fe 1/20 [facility] [address] [facility] [place], [place].

## 2018-05-06 NOTE — Telephone Encounter (Signed)
Medication refill request: JUNEL FE Last AEX:  04/29/17 SM Next AEX: 07/01/18 Last MMG (if hormonal medication request): 04/29/17 BIRADS 1 negative/density c -- scheduled 07/01/18 Refill authorized: 06/12/17 #84 w/3 refills; order pended for #84 w/0 if authorized, please advise

## 2018-05-11 DIAGNOSIS — L609 Nail disorder, unspecified: Secondary | ICD-10-CM | POA: Diagnosis not present

## 2018-05-11 DIAGNOSIS — L603 Nail dystrophy: Secondary | ICD-10-CM | POA: Diagnosis not present

## 2018-05-11 DIAGNOSIS — L649 Androgenic alopecia, unspecified: Secondary | ICD-10-CM | POA: Diagnosis not present

## 2018-06-22 ENCOUNTER — Telehealth: Payer: Self-pay | Admitting: Obstetrics & Gynecology

## 2018-06-22 NOTE — Telephone Encounter (Signed)
Dr. Hyacinth Meeker -please advise on lab orders at outside facility prior to AEX on 07/01/18.

## 2018-06-22 NOTE — Telephone Encounter (Signed)
Patient has her aex 07/01/2018 with Dr.Miller. She lives in [place] and is asking if she could have an order sent to the [facility] in [place] for her labs? She would like to have theses labs done before her aex.

## 2018-06-23 ENCOUNTER — Other Ambulatory Visit: Payer: Self-pay | Admitting: Obstetrics & Gynecology

## 2018-06-23 DIAGNOSIS — Z0000 Encounter for general adult medical examination without abnormal findings: Secondary | ICD-10-CM

## 2018-06-23 NOTE — Telephone Encounter (Signed)
Call returned to patient, left detailed message, ok per dpr. Advised as seen below per Dr. Hyacinth Meeker. Return call to office if any additional questions or concerns.   Encounter closed.

## 2018-06-23 NOTE — Telephone Encounter (Signed)
Orders have been placed with [facility] and released so she should be able to go any [facility] location and have them done. I placed them last in January the exact same way but she never went to have blood work done, just [facility].

## 2018-07-01 ENCOUNTER — Other Ambulatory Visit: Payer: Self-pay | Admitting: Obstetrics & Gynecology

## 2018-07-01 ENCOUNTER — Other Ambulatory Visit: Payer: Self-pay

## 2018-07-01 ENCOUNTER — Ambulatory Visit
Admission: RE | Admit: 2018-07-01 | Discharge: 2018-07-01 | Disposition: A | Discharge status: Home / Self Care | Payer: BLUE CROSS/BLUE SHIELD | Source: Ambulatory Visit | Attending: Obstetrics & Gynecology | Admitting: Obstetrics & Gynecology

## 2018-07-01 ENCOUNTER — Encounter: Payer: Self-pay | Admitting: Obstetrics & Gynecology

## 2018-07-01 ENCOUNTER — Ambulatory Visit (INDEPENDENT_AMBULATORY_CARE_PROVIDER_SITE_OTHER): Payer: BLUE CROSS/BLUE SHIELD | Admitting: Obstetrics & Gynecology

## 2018-07-01 VITALS — BP 122/86 | HR 60 | Resp 16 | Ht 63.5 in | Wt 136.4 lb

## 2018-07-01 DIAGNOSIS — N926 Irregular menstruation, unspecified: Secondary | ICD-10-CM | POA: Diagnosis not present

## 2018-07-01 DIAGNOSIS — Z1231 Encounter for screening mammogram for malignant neoplasm of breast: Secondary | ICD-10-CM | POA: Diagnosis not present

## 2018-07-01 DIAGNOSIS — Z01419 Encounter for gynecological examination (general) (routine) without abnormal findings: Secondary | ICD-10-CM

## 2018-07-01 DIAGNOSIS — Z0000 Encounter for general adult medical examination without abnormal findings: Secondary | ICD-10-CM | POA: Diagnosis not present

## 2018-07-01 DIAGNOSIS — N764 Abscess of vulva: Secondary | ICD-10-CM

## 2018-07-01 MED ORDER — NORETHIN-ETH ESTRAD-FE BIPHAS 1 MG-10 MCG / 10 MCG PO TABS: 1.0000 | ORAL_TABLET | Freq: Every day | ORAL | 3 refills | Status: DC

## 2018-07-01 NOTE — Progress Notes (Signed)
48 y.o. G58P1102 Married White or Caucasian female here for annual exam.  Doing well.  Has lost weight due to increase in daily exercise but not necessarily trying.  [place] 45 min[place]1 hour [place]    Cycles are regular.  Last few cycles, she's had bleeding for one or two days in the second week of her pack of pills.  Cycles is about three days and, then the last few months, cycle stops and restarts for one or two days in second week of pills.  Does not think pill has changed on her but taking a generic.      Patient's last menstrual period was 06/07/2018 (approximate).          Sexually active: Yes.    The current method of family planning is OCP (estrogen/progesterone).    Exercising: Yes.    walking  Smoker:  no  Health Maintenance: Pap:  04/29/17 Neg. HR HPV:neg   05/30/14 Neg. HR HPV:neg  History of abnormal Pap:  no MMG:  04/29/17 BIRADS1:neg. [facility] Colonoscopy:  11/06/14 polyps. F/u age 76 BMD:   never TDaP:  2012 Screening Labs: here today - not fasting    reports that she has never smoked. She has never used smokeless tobacco. She reports current alcohol use of about 3.0 standard drinks of alcohol per week. She reports that she does not use drugs.  Past Medical History:  Diagnosis Date  . Chicken pox as a child  . DVT (deep venous thrombosis) (HCC)    right after C section  . Other and unspecified hyperlipidemia 08/04/2012  . Pre-eclampsia at 26 weeks  . Preventative health care 08/04/2012  . Previous pregnancy with HELLP syndrome, antepartum 08/04/2012  . Varicose veins 08/04/2012  . Vulvar abscess 08/04/2012    Past Surgical History:  Procedure Laterality Date  . CESAREAN SECTION  06-25-03  . VARICOSE VEIN SURGERY Left   . WISDOM TOOTH EXTRACTION  early 20's    Current Outpatient Medications  Medication Sig Dispense Refill  . Multiple Vitamin (MULTIVITAMIN) tablet Take 1 tablet by mouth daily.    . norethindrone-ethinyl  estradiol (JUNEL FE 1/20) 1-20 MG-MCG tablet Take 1 tablet by mouth daily. 84 tablet 0  . Omega-3 Fatty Acids (FISH OIL CONCENTRATE) 300 MG CAPS Take by mouth.     No current facility-administered medications for this visit.     Family History  Problem Relation Age of Onset  . Dementia Maternal Grandmother        late age  . Alzheimer's disease Maternal Grandfather     Review of Systems  All other systems reviewed and are negative.   Exam:   BP 122/86 (BP Location: Right Arm, Patient Position: Sitting, Cuff Size: Normal)   Pulse 60   Resp 16   Ht 5' 3.5" (1.613 m)   Wt 136 lb 6.4 oz (61.9 kg)   LMP 06/07/2018 (Approximate)   BMI 23.78 kg/m  Weight: -12#  Height: 5' 3.5" (161.3 cm)  Ht Readings from Last 3 Encounters:  07/01/18 5' 3.5" (1.613 m)  04/29/17 5' 3.25" (1.607 m)  06/28/15 5' 3.5" (1.613 m)    General appearance: alert, cooperative and appears stated age Head: Normocephalic, without obvious abnormality, atraumatic Neck: no adenopathy, supple, symmetrical, trachea midline and thyroid normal to inspection and palpation Lungs: clear to auscultation bilaterally Breasts: normal appearance, no masses or tenderness Heart: regular rate and rhythm Abdomen: soft, non-tender; bowel sounds normal; no  masses,  no organomegaly Extremities: extremities normal, atraumatic, no cyanosis or edema Skin: Skin color, texture, turgor normal. No rashes or lesions Lymph nodes: Cervical, supraclavicular, and axillary nodes normal. No abnormal inguinal nodes palpated Neurologic: Grossly normal   Pelvic: External genitalia:  Recurrent vulvar abscess that is noted today about 1 cm in size, to the right lateral edge of right labia majora              Urethra:  normal appearing urethra with no masses, tenderness or lesions              Bartholins and Skenes: normal                 Vagina: normal appearing vagina with normal color and discharge, no lesions              Cervix: no  lesions              Pap taken: No. Bimanual Exam:  Uterus:  normal size, contour, position, consistency, mobility, non-tender              Adnexa: normal adnexa and no mass, fullness, tenderness               Rectovaginal: Confirms               Anus:  normal sphincter tone, no lesions  Chaperone was present for exam.  A:  Well Woman with normal exam Irregular bleeding for last few months H/O recurrent right vulvar abscess.  Has done MRI and saw gyn/onc for consideration of excision.  Decided not to schedule surgery. Remote hx of DVT after cesarean section years ago.  Saw Dr. Myna Hidalgo (hem/onc) for consultation.  He felt OCPs were safe in this pt.  P:   Mammogram guidelines reviewed.  Has appt today pap smear with neg HR HPV 2019.  Not indicated today CBC, CMP, lipids, TSH, Vit D obtained today FSH obtained today as well Will switch to Loloestrin (brand) OCP and if bleeding irregularity continues, will have her return for PUS.  Pt knows to call if having any irregular bleeding after 3 months. Wound culture obtained today.   return annually or prn

## 2018-07-02 ENCOUNTER — Other Ambulatory Visit: Payer: Self-pay | Admitting: Obstetrics & Gynecology

## 2018-07-02 DIAGNOSIS — R928 Other abnormal and inconclusive findings on diagnostic imaging of breast: Secondary | ICD-10-CM

## 2018-07-02 LAB — LIPID PANEL
Chol/HDL Ratio: 1.9 ratio (ref 0.0–4.4)
Cholesterol, Total: 202 mg/dL — ABNORMAL HIGH (ref 100–199)
HDL: 105 mg/dL (ref >39)
LDL Calculated: 87 mg/dL (ref 0–99)
Triglycerides: 50 mg/dL (ref 0–149)
VLDL Cholesterol Cal: 10 mg/dL (ref 5–40)

## 2018-07-02 LAB — CBC
Hematocrit: 38.1 % (ref 34.0–46.6)
Hemoglobin: 12.8 g/dL (ref 11.1–15.9)
MCH: 32.6 pg (ref 26.6–33.0)
MCHC: 33.6 g/dL (ref 31.5–35.7)
MCV: 97 fL (ref 79–97)
Platelets: 382 10*3/uL (ref 150–450)
RBC: 3.93 x10E6/uL (ref 3.77–5.28)
RDW: 12.4 % (ref 11.7–15.4)
WBC: 6.4 10*3/uL (ref 3.4–10.8)

## 2018-07-02 LAB — COMPREHENSIVE METABOLIC PANEL
ALBUMIN: 4.8 g/dL (ref 3.8–4.8)
ALT: 14 IU/L (ref 0–32)
AST: 13 IU/L (ref 0–40)
Albumin/Globulin Ratio: 2.1 (ref 1.2–2.2)
Alkaline Phosphatase: 59 IU/L (ref 39–117)
BUN / CREAT RATIO: 21 (ref 9–23)
BUN: 12 mg/dL (ref 6–24)
Bilirubin Total: 0.8 mg/dL (ref 0.0–1.2)
CO2: 23 mmol/L (ref 20–29)
Calcium: 9.8 mg/dL (ref 8.7–10.2)
Chloride: 102 mmol/L (ref 96–106)
Creatinine, Ser: 0.58 mg/dL (ref 0.57–1.00)
GFR calc Af Amer: 129 mL/min/{1.73_m2} (ref >59)
GFR calc non Af Amer: 112 mL/min/{1.73_m2} (ref >59)
GLUCOSE: 88 mg/dL (ref 65–99)
Globulin, Total: 2.3 g/dL (ref 1.5–4.5)
Potassium: 4.7 mmol/L (ref 3.5–5.2)
Sodium: 140 mmol/L (ref 134–144)
Total Protein: 7.1 g/dL (ref 6.0–8.5)

## 2018-07-02 LAB — FOLLICLE STIMULATING HORMONE: FSH: 3.1 m[IU]/mL

## 2018-07-02 LAB — TSH: TSH: 1.49 u[IU]/mL (ref 0.450–4.500)

## 2018-07-02 LAB — VITAMIN D 25 HYDROXY (VIT D DEFICIENCY, FRACTURES): Vit D, 25-Hydroxy: 27.5 ng/mL — ABNORMAL LOW (ref 30.0–100.0)

## 2018-07-03 LAB — WOUND CULTURE: Organism ID, Bacteria: NONE SEEN

## 2018-07-08 ENCOUNTER — Other Ambulatory Visit: Payer: Self-pay

## 2018-07-08 ENCOUNTER — Ambulatory Visit: Payer: BLUE CROSS/BLUE SHIELD

## 2018-07-08 ENCOUNTER — Other Ambulatory Visit: Payer: BLUE CROSS/BLUE SHIELD

## 2018-07-08 ENCOUNTER — Ambulatory Visit
Admission: RE | Admit: 2018-07-08 | Discharge: 2018-07-08 | Disposition: A | Discharge status: Home / Self Care | Payer: BLUE CROSS/BLUE SHIELD | Source: Ambulatory Visit | Attending: Obstetrics & Gynecology | Admitting: Obstetrics & Gynecology

## 2018-07-08 ENCOUNTER — Telehealth: Payer: Self-pay | Admitting: Obstetrics & Gynecology

## 2018-07-08 DIAGNOSIS — R928 Other abnormal and inconclusive findings on diagnostic imaging of breast: Secondary | ICD-10-CM

## 2018-07-08 DIAGNOSIS — N926 Irregular menstruation, unspecified: Secondary | ICD-10-CM

## 2018-07-08 DIAGNOSIS — R921 Mammographic calcification found on diagnostic imaging of breast: Secondary | ICD-10-CM | POA: Diagnosis not present

## 2018-07-08 NOTE — Telephone Encounter (Signed)
Patient calling for results.

## 2018-07-08 NOTE — Telephone Encounter (Signed)
Message left to return call to Brandy Parsons at [phone].    

## 2018-07-08 NOTE — Telephone Encounter (Signed)
-----   Message from Jerene Bears, MD sent at 07/07/2018  2:46 PM EDT ----- Please let pt know her wound culture grew Group B strep.  She has this same issue recurrently every month.  She saw gyn/onc about removal as it feels deep and I question whether she has endometriosis in this location.  She decided not to have anything done.  It doesn't grow but is just recurrent.  If she desires, ok to treat with amoxicillin 500mg  tid x 5 days but it is likely already better and she won't want this.  Please let her know that her CBC, CMP, cholesterol and Vit D were all fine.  Vit D was 27.5.  Goal is 30 so she could add 1000 IU daily but it is very close to normal.    FSH is normal.  I do think she should proceed with a PUS to evaluate the change in her bleeding more thoroughly.  Thanks.

## 2018-07-09 NOTE — Telephone Encounter (Signed)
Message left to return call to Brandy Parsons at [phone].    

## 2018-07-12 MED ORDER — AMOXICILLIN 500 MG PO CAPS: 500.0000 mg | ORAL_CAPSULE | Freq: Three times a day (TID) | ORAL | 0 refills | Status: AC

## 2018-07-12 NOTE — Telephone Encounter (Signed)
Spoke with patient and she is given results of lab tests. Would like to try Amoxicillin. Instructions given.  She will call back for Pelvic ultrasound planning.  Will call back with any concerns.  Encounter closed.

## 2018-07-13 ENCOUNTER — Telehealth: Payer: Self-pay | Admitting: Obstetrics & Gynecology

## 2018-07-13 NOTE — Telephone Encounter (Signed)
Call placed to patient to convey benefits and scheduled recommended ultrasound. Left voicemail message requesting a return call ° °

## 2018-07-14 NOTE — Telephone Encounter (Signed)
Second call placed to patient to convey benefits and to scheduled ultrasound. Left a voicemail message requesting a return call

## 2018-07-16 ENCOUNTER — Telehealth: Payer: Self-pay | Admitting: Obstetrics & Gynecology

## 2018-07-16 NOTE — Telephone Encounter (Signed)
Patient requesting 1 refill of the amoxicillin to wipe out the infection. [phone].

## 2018-07-16 NOTE — Telephone Encounter (Signed)
Made 2nd call to patient about amoxicillin. Left message to call back Monday.

## 2018-07-16 NOTE — Telephone Encounter (Signed)
Called patient to get update on symptoms. Left message at number on DPR to return call to [facility].

## 2018-07-22 NOTE — Telephone Encounter (Signed)
Dr.Miller, Babs Sciara attempted to reach patient x 2 with no return call. Okay to close?

## 2018-09-30 ENCOUNTER — Telehealth: Payer: Self-pay | Admitting: Obstetrics & Gynecology

## 2018-09-30 NOTE — Telephone Encounter (Signed)
Call placed to patient to review benefits and to scheduled a recommended ultrasound. Left voicemail message requesting a return call

## 2018-10-18 NOTE — Telephone Encounter (Signed)
Call placed to patient to follow up and scheduled recommended ultrasound. Left voicemail message requesting a return call °

## 2018-10-20 NOTE — Telephone Encounter (Signed)
Call placed to follow up and scheduled recommended ultrasound. Left voicemail message requesting a return call    cc: Lamont Snowball, RN

## 2018-11-09 NOTE — Telephone Encounter (Signed)
No response from patient to schedule pelvic ultrasound.   Routing to provider for review.

## 2018-11-10 NOTE — Telephone Encounter (Signed)
Ok to remove order and close encounter.  She was clearly aware of recommendation at her AEX.

## 2019-05-29 ENCOUNTER — Other Ambulatory Visit: Payer: Self-pay | Admitting: Obstetrics & Gynecology

## 2019-05-30 NOTE — Telephone Encounter (Signed)
Medication refill request: Lo Loestrin  Last AEX:  07/01/18  Next AEX: 11/17/69 Last MMG (if hormonal medication request): 07/08/2018 Bi-rads 1 neg  Refill authorized: #84 with 1 RF

## 2019-09-01 DIAGNOSIS — L728 Other follicular cysts of the skin and subcutaneous tissue: Secondary | ICD-10-CM | POA: Diagnosis not present

## 2019-09-13 DIAGNOSIS — L728 Other follicular cysts of the skin and subcutaneous tissue: Secondary | ICD-10-CM | POA: Diagnosis not present

## 2019-11-01 ENCOUNTER — Other Ambulatory Visit: Payer: Self-pay | Admitting: Obstetrics & Gynecology

## 2019-11-14 ENCOUNTER — Other Ambulatory Visit: Payer: Self-pay | Admitting: Obstetrics & Gynecology

## 2019-11-14 DIAGNOSIS — Z1231 Encounter for screening mammogram for malignant neoplasm of breast: Secondary | ICD-10-CM

## 2019-11-15 NOTE — Progress Notes (Deleted)
49 y.o. G22P1102 Married White or Caucasian female here for annual exam.    No LMP recorded.          Sexually active: {yes no:314532}  The current method of family planning is {contraception:315051}.    Exercising: {yes no:314532}  {types:19826} Smoker:  {YES NO:22349}  Health Maintenance: Pap:  04-29-17 neg HPV HR neg History of abnormal Pap:  no MMG:  07-02-2018, 07-08-2018 bilateral category c density birads 1:neg Colonoscopy:  11-06-14 polyps f/u age 56 BMD:   none TDaP:  2012 Pneumonia vaccine(s):  *** Shingrix:   *** Hep C testing: *** Screening Labs: ***   reports that she has never smoked. She has never used smokeless tobacco. She reports current alcohol use of about 3.0 standard drinks of alcohol per week. She reports that she does not use drugs.  Past Medical History:  Diagnosis Date  . Chicken pox as a child  . DVT (deep venous thrombosis) (HCC)    right after C section  . Other and unspecified hyperlipidemia 08/04/2012  . Pre-eclampsia at 26 weeks  . Preventative health care 08/04/2012  . Previous pregnancy with HELLP syndrome, antepartum 08/04/2012  . Varicose veins 08/04/2012  . Vulvar abscess 08/04/2012    Past Surgical History:  Procedure Laterality Date  . CESAREAN SECTION  06-25-03  . VARICOSE VEIN SURGERY Left   . WISDOM TOOTH EXTRACTION  early 20's    Current Outpatient Medications  Medication Sig Dispense Refill  . LO LOESTRIN FE 1 MG-10 MCG / 10 MCG tablet TAKE 1 TABLET BY MOUTH EVERY DAY 84 tablet 1  . Multiple Vitamin (MULTIVITAMIN) tablet Take 1 tablet by mouth daily.    . norethindrone-ethinyl estradiol (JUNEL FE 1/20) 1-20 MG-MCG tablet Take 1 tablet by mouth daily. 84 tablet 0  . Omega-3 Fatty Acids (FISH OIL CONCENTRATE) 300 MG CAPS Take by mouth.     No current facility-administered medications for this visit.    Family History  Problem Relation Age of Onset  . Dementia Maternal Grandmother        late age  . Alzheimer's disease Maternal  Grandfather     Review of Systems  Exam:   There were no vitals taken for this visit.     General appearance: alert, cooperative and appears stated age Head: Normocephalic, without obvious abnormality, atraumatic Neck: no adenopathy, supple, symmetrical, trachea midline and thyroid {EXAM; THYROID:18604} Lungs: clear to auscultation bilaterally Breasts: {Exam; breast:13139::"normal appearance, no masses or tenderness"} Heart: regular rate and rhythm Abdomen: soft, non-tender; bowel sounds normal; no masses,  no organomegaly Extremities: extremities normal, atraumatic, no cyanosis or edema Skin: Skin color, texture, turgor normal. No rashes or lesions Lymph nodes: Cervical, supraclavicular, and axillary nodes normal. No abnormal inguinal nodes palpated Neurologic: Grossly normal   Pelvic: External genitalia:  no lesions              Urethra:  normal appearing urethra with no masses, tenderness or lesions              Bartholins and Skenes: normal                 Vagina: normal appearing vagina with normal color and discharge, no lesions              Cervix: {exam; cervix:14595}              Pap taken: {yes no:314532} Bimanual Exam:  Uterus:  {exam; uterus:12215}  Adnexa: {exam; adnexa:12223}               Rectovaginal: Confirms               Anus:  normal sphincter tone, no lesions  Chaperone, ***Terence Lux, CMA, was present for exam.  A:  Well Woman with normal exam  P:   {plan; gyn:5269::"mammogram","pap smear","return annually or prn"}

## 2019-11-18 ENCOUNTER — Other Ambulatory Visit: Payer: Self-pay | Admitting: Obstetrics & Gynecology

## 2019-11-18 ENCOUNTER — Ambulatory Visit: Payer: BLUE CROSS/BLUE SHIELD

## 2019-11-18 ENCOUNTER — Ambulatory Visit: Payer: BC Managed Care – PPO | Admitting: Obstetrics & Gynecology

## 2019-11-18 NOTE — Telephone Encounter (Signed)
Will need OV for additional refills.  Needs MMG updated as well.  #84/0RF sent to pharmacy.

## 2019-11-18 NOTE — Telephone Encounter (Signed)
Medication refill request: Lo Loestrin FE Last AEX:  07/01/18 SM Next AEX: tried calling patient to scheduled. No answer, left message for patient to call and schedule Last MMG (if hormonal medication request): 07/08/18 BIRADS 1 negative Refill authorized: Today, please advise

## 2019-11-21 DIAGNOSIS — L728 Other follicular cysts of the skin and subcutaneous tissue: Secondary | ICD-10-CM | POA: Diagnosis not present

## 2019-12-26 ENCOUNTER — Telehealth: Payer: Self-pay | Admitting: Obstetrics & Gynecology

## 2019-12-26 NOTE — Telephone Encounter (Signed)
Left message to call Laguana Desautel, RN at [facility] [phone].   

## 2019-12-26 NOTE — Telephone Encounter (Signed)
Patient's aex was cancelled because she was late for appointment. She's calling to reschedule. Sending to triage to assist with scheduling since she can not come in for appointment that was offered.

## 2019-12-28 NOTE — Telephone Encounter (Signed)
Spoke with patient. Patient request late afternoon appt, she travels 2 hours one way.  AEX r/s to 01/26/20 at 3:30pm.  Patient is agreeable to date and time.  Encounter closed.

## 2019-12-28 NOTE — Telephone Encounter (Signed)
Left message to call Lewis Keats, RN at [facility] [phone].   

## 2020-01-24 NOTE — Progress Notes (Signed)
49 y.o. G71P1102 Married White or Caucasian female here for annual exam.  Has history of recurrent vulvar abscess.  She and I have discussed this almost every year since I first met her.  In 2016 she did have consultation with Dr. Everitt Amber for possible removal.  Pelvic MRI was done at that time.  Typically this area is quite bothersome when she is on her menstrual cycle but otherwise heels over.  However, within the last year, she pretty much has some mild drainage all the time.  She wears a mini pad all the time.  It swells as it gets closer to her cycle and looks more to her like a blood blister.  It will then rupture and drain and improve but then the cycle starts all over again.  She is ready to proceed with treatment.  She is also having irregular bleeding.  She is on low Loestrin.  She has a history of post C-section DVT.  She did have hematology consultation with Dr. Marin Olp in 2014.  She had a thorough hypercoagulable work-up.  I was not seen the patient at that time.  She is being followed by Dr. Cletis Media.  Patient wanted to be on oral contraceptives and Dr. Marin Olp thought it was safe for her.  Because of the irregular bleeding, she would like to change her OCP.  I am not comfortable increasing her dosage to work on a switch to a progesterone only pill at this time.    Patient is also having some mid right abdominal pain that seems to be present almost every single day.  It does have a cyclic nature to it does seem to be worse with her menstrual cycles as well.  There are times when it is a very pinching sharp pain.  But most of the time it is a dull ache.  Patient's last menstrual period was 01/17/2020 (exact date).          Sexually active: Yes.    The current method of family planning is OCP (estrogen/progesterone).    Exercising: Yes.    walking Smoker:  no  Health Maintenance: Pap:  04-29-17 neg HPV HR neg History of abnormal Pap:  no MMG:  07-08-2018 category c density birads  1:neg Colonoscopy:  11-06-14 polyps f/u age 38 BMD:   none TDaP:  2012 Pneumonia vaccine(s):  no Shingrix:  no Hep C testing: not done Screening Labs: 06/2018   reports that she has never smoked. She has never used smokeless tobacco. She reports current alcohol use of about 2.0 standard drinks of alcohol per week. She reports that she does not use drugs.  Past Medical History:  Diagnosis Date   Chicken pox as a child   DVT (deep venous thrombosis) ([place])    right after C section   Other and unspecified hyperlipidemia 08/04/2012   Pre-eclampsia at 26 weeks   Preventative health care 08/04/2012   Previous pregnancy with HELLP syndrome, antepartum 08/04/2012   Varicose veins 08/04/2012   Vulvar abscess 08/04/2012    Past Surgical History:  Procedure Laterality Date   CESAREAN SECTION  06-25-03   VARICOSE VEIN SURGERY Left    WISDOM TOOTH EXTRACTION  early 20's    Current Outpatient Medications  Medication Sig Dispense Refill   LO LOESTRIN FE 1 MG-10 MCG / 10 MCG tablet TAKE 1 TABLET BY MOUTH EVERY DAY 84 tablet 0   Multiple Vitamin (MULTIVITAMIN) tablet Take 1 tablet by mouth daily.     No current facility-administered  medications for this visit.    Family History  Problem Relation Age of Onset   Dementia Maternal Grandmother        late age   Alzheimer's disease Maternal Grandfather     Review of Systems  Constitutional: Negative.   HENT: Negative.   Eyes: Negative.   Respiratory: Negative.   Cardiovascular: Negative.   Gastrointestinal: Negative.   Endocrine: Negative.   Genitourinary: Negative.   Musculoskeletal: Negative.   Skin: Negative.   Allergic/Immunologic: Negative.   Neurological: Negative.   Hematological: Negative.   Psychiatric/Behavioral: Negative.     Exam:   BP 110/74    Pulse 68    Resp 16    Ht 5' 3.75" (1.619 m)    Wt 150 lb (68 kg)    LMP 01/17/2020 (Exact Date)    BMI 25.95 kg/m   Height: 5' 3.75" (161.9 cm)  General appearance:  alert, cooperative and appears stated age Head: Normocephalic, without obvious abnormality, atraumatic Neck: no adenopathy, supple, symmetrical, trachea midline and thyroid normal to inspection and palpation Lungs: clear to auscultation bilaterally Breasts: normal appearance, no masses or tenderness Heart: regular rate and rhythm Abdomen: soft, non-tender; bowel sounds normal; no masses,  no organomegaly Extremities: extremities normal, atraumatic, no cyanosis or edema Skin: Skin color, texture, turgor normal. No rashes or lesions Lymph nodes: Cervical, supraclavicular, and axillary nodes normal. No abnormal inguinal nodes palpated Neurologic: Grossly normal   Pelvic: External genitalia:  1.5 x 1.5cm raise exophytic lesion (picture taken)             Urethra:  normal appearing urethra with no masses, tenderness or lesions              Bartholins and Skenes: normal                 Vagina: normal appearing vagina with normal color and discharge, no lesions              Cervix: no lesions              Pap taken: Yes.   Bimanual Exam:  Uterus:  normal size, contour, position, consistency, mobility, non-tender              Adnexa: normal adnexa and no mass, fullness, tenderness               Rectovaginal: Confirms               Anus:  normal sphincter tone, no lesions     Chaperone, Terence Lux, CMA, was present for exam.  A:  Well Woman with normal exam Irregular bleeding with low Loestrin over the last few months History of recurrent right vulvar abscess.  Has done MRI and saw Dr. Denman George in the past.  Lesion is much worse in appearance today.  Patient is aware I have wondered if endometriosis is part of this lesion considering the cyclic nature around her cycle. Remote history of DVT after cesarean section.  Saw Dr. Marin Olp for consultation.  He felt low-dose OCPs were safe. Mid abdominal pain that is been present for most of the last year  P:   Mammogram guidelines reviewed.   Patient is aware this is overdue pap smear with high-risk HPV obtained today We will communicate with Dr. Denman George about best place for patient to receive care as she lives in the [place] area now.  I will review with Dr. Denman George whether imaging should be done prior to consultation. Wound culture was  obtained today. We will switch to Micronor, 1 p.o. daily, #84/3 refills.  If this does not help bleeding we will plan pelvic ultrasound CA-125 obtained today return annually or prn

## 2020-01-26 ENCOUNTER — Other Ambulatory Visit: Payer: Self-pay

## 2020-01-26 ENCOUNTER — Other Ambulatory Visit (HOSPITAL_COMMUNITY)
Admission: RE | Admit: 2020-01-26 | Discharge: 2020-01-26 | Disposition: A | Discharge status: Home / Self Care | Payer: Self-pay | Source: Ambulatory Visit | Attending: Obstetrics & Gynecology | Admitting: Obstetrics & Gynecology

## 2020-01-26 ENCOUNTER — Ambulatory Visit (INDEPENDENT_AMBULATORY_CARE_PROVIDER_SITE_OTHER): Payer: BC Managed Care – PPO | Admitting: Obstetrics & Gynecology

## 2020-01-26 ENCOUNTER — Encounter: Payer: Self-pay | Admitting: Obstetrics & Gynecology

## 2020-01-26 VITALS — BP 110/74 | HR 68 | Resp 16 | Ht 63.75 in | Wt 150.0 lb

## 2020-01-26 DIAGNOSIS — N764 Abscess of vulva: Secondary | ICD-10-CM

## 2020-01-26 DIAGNOSIS — Z124 Encounter for screening for malignant neoplasm of cervix: Secondary | ICD-10-CM | POA: Insufficient documentation

## 2020-01-26 DIAGNOSIS — Z01419 Encounter for gynecological examination (general) (routine) without abnormal findings: Secondary | ICD-10-CM

## 2020-01-26 DIAGNOSIS — R1010 Upper abdominal pain, unspecified: Secondary | ICD-10-CM | POA: Diagnosis not present

## 2020-01-26 MED ORDER — NORETHINDRONE 0.35 MG PO TABS: 1.0000 | ORAL_TABLET | Freq: Every day | ORAL | 3 refills | Status: DC

## 2020-01-27 LAB — CYTOLOGY - PAP
Comment: NEGATIVE
Diagnosis: NEGATIVE
High risk HPV: NEGATIVE

## 2020-01-27 LAB — CA 125: Cancer Antigen (CA) 125: 8.9 U/mL (ref 0.0–38.1)

## 2020-01-30 ENCOUNTER — Telehealth: Payer: Self-pay | Admitting: *Deleted

## 2020-01-30 ENCOUNTER — Encounter: Payer: Self-pay | Admitting: Obstetrics & Gynecology

## 2020-01-30 DIAGNOSIS — R1010 Upper abdominal pain, unspecified: Secondary | ICD-10-CM

## 2020-01-30 DIAGNOSIS — N764 Abscess of vulva: Secondary | ICD-10-CM

## 2020-01-30 LAB — WOUND CULTURE: Organism ID, Bacteria: NONE SEEN

## 2020-01-30 NOTE — Telephone Encounter (Signed)
-----   Message from Jerene Bears, MD sent at 01/30/2020  1:04 PM EDT ----- Regarding: vulvar abscess/possible endometriosis Noreene Larsson, I saw this pt last week.  She has a recurrent vulvar lesion that was opened once several years ago by a gynecolgist.  She has recurrent issues with this lesion filling up with blood, pus and becoming very tender and then draining.  It is worse around her menstrual cycles.  I had her see Dr. Andrey Farmer, gyn/onc in 2016.  Pt has moved multiple times but is now ready to have this excised.  She lives in [place].  I asked Dr. Andrey Farmer for her partners in the [place] area.  I took a picture of the lesion and put it in my most recent note.  I think she may have endometriosis.  I did a ca-125 on her that was normal.  She is also having abdominal pain.  I think this should all be handled with the consult.  She had a pelvic MRI in 2016 and it should be repeated but it makes sense to me that this be done after the consult so the surgeon will get what he/she wants.    Gyn/oncologists are Livia Snellen, Dell Ponto and Stark Jock.  Personally, I'd see Dr. Gaynelle Cage because he is an excellent, excellent surgeon with a lot of experience.  Here is the referral information.   Records should be faxed to: [phone].  Marciano Sequin is the person who takes the consult information at phone number [phone].   Can you please call the patient and relay all of this and get the referral going?  Thanks.

## 2020-01-30 NOTE — Telephone Encounter (Signed)
Left message to call Codie Hainer, RN at [facility] [phone].   

## 2020-01-31 NOTE — Telephone Encounter (Signed)
Left message to call Hiep Ollis, RN at [facility] [phone].   

## 2020-01-31 NOTE — Telephone Encounter (Signed)
Spoke with patient, advised per Dr. Hyacinth Meeker. Patient agreeable to proceed with referral to Dr. Stark Jock. Patient states if appt is going to be several weeks out, she is requesting to proceed with an MRI now "for her own piece of mind". Advised I will get referral started, if appt is going to be several weeks out, I will review with Dr. Hyacinth Meeker prior to returning call, patient agreeable.   Order placed for ambulatory referral to GYN/ONC.   Call placed to [facility] at [phone], left detailed message requesting return call to schedule. Records have been faxed.

## 2020-01-31 NOTE — Telephone Encounter (Signed)
See MyChart encounter dated 01/30/20.   Encounter closed.

## 2020-01-31 NOTE — Telephone Encounter (Signed)
Routing to [employer]

## 2020-01-31 NOTE — Telephone Encounter (Signed)
MyChart message to patient.  

## 2020-01-31 NOTE — Telephone Encounter (Signed)
Patient is returning call. Patient

## 2020-01-31 NOTE — Telephone Encounter (Signed)
Patient is returning call.  °

## 2020-01-31 NOTE — Telephone Encounter (Signed)
Georgann Housekeeper, RN Revonda Standard from [facility] called regarding patient with last name Mcgivern. She will call the patient and get her scheduled once the referral and records are faxed over. The office number is [phone] and fax number is [phone]

## 2020-01-31 NOTE — Telephone Encounter (Addendum)
Left message to call Noreene Larsson, RN at [facility] [phone].   Brandy Parsons  P [facility] I saw my lab results but wondering if you had any follow up from the Dr to possibly remove my cyst, as well as scheduling an MRI for the mass I feel in my upper stomach.   Thank you  Brandy Parsons  [phone]

## 2020-02-01 NOTE — Telephone Encounter (Signed)
Patient is calling in regards to have an update about referral.

## 2020-02-01 NOTE — Telephone Encounter (Signed)
Call placed to patient in regards to a referral to Dr. Ruthe Mannan.

## 2020-02-01 NOTE — Telephone Encounter (Signed)
Left detailed message for pt with Dr Ruthe Mannan office has received referral and records and will call pt to schedule per [facility]. Pt to return call with any questions or concerns or can wait to speak with Noreene Larsson, RN on Monday 10/11.

## 2020-02-02 ENCOUNTER — Encounter: Payer: Self-pay | Admitting: Obstetrics & Gynecology

## 2020-02-02 NOTE — Telephone Encounter (Signed)
Patient called to review referral information. Patient understanding referral was received at Dr. Ruthe Mannan office. Patient stated.she "will call Dr. Ruthe Mannan office today to get scheduled".

## 2020-02-03 NOTE — Telephone Encounter (Signed)
She should have a RUQ ultrasound first.  An abdominal MRI and pelvic MRI are two separate tests and are typically not done at the same time or on the same day.  The abdominal MRI will not get covered by insurance.  Dx:  RUQ pain

## 2020-02-03 NOTE — Telephone Encounter (Signed)
Routing to Dr Hyacinth Meeker for review and update. Please advise on MRI per pt's request.

## 2020-02-03 NOTE — Telephone Encounter (Signed)
Pt sent following Mychart message 02/02/20 for update on referral.  Tejal, [place] [facility] Hey there! I have an appointment with Dr Ruthe Mannan at [facility] on the 19th. Is it possible for you to request an MRI to see what is going on in my upper stomach as well as my abdomen prior to this appointment?   Close by is  [facility] [place] at  [phone]   Thank you  Zeenat Jeanbaptiste  [phone]

## 2020-02-07 NOTE — Telephone Encounter (Signed)
Left detailed message for per. Ok per DRP.

## 2020-02-11 ENCOUNTER — Other Ambulatory Visit: Payer: Self-pay | Admitting: Obstetrics & Gynecology

## 2020-02-13 NOTE — Telephone Encounter (Signed)
Left message for pt to return call to triage RN. 

## 2020-02-14 DIAGNOSIS — R109 Unspecified abdominal pain: Secondary | ICD-10-CM | POA: Diagnosis not present

## 2020-02-14 DIAGNOSIS — Z6824 Body mass index (BMI) 24.0-24.9, adult: Secondary | ICD-10-CM | POA: Diagnosis not present

## 2020-02-14 DIAGNOSIS — N764 Abscess of vulva: Secondary | ICD-10-CM | POA: Diagnosis not present

## 2020-02-15 NOTE — Telephone Encounter (Signed)
Attempted to call pt several times with no return call. Pt was seen by Dr Ruthe Mannan on 10/19 per [facility] notes and has scheduled MRI W WO contrast.   Routing to Dr Hyacinth Meeker.  Ok to close encounter?

## 2020-02-16 NOTE — Telephone Encounter (Signed)
Yes.  Ok to close encounter.  Dr. Ruthe Mannan was able to order the MRI as abdomen and pelvis.  This is a benefit of seeing gyn/oncology for this full evaluation.

## 2020-02-28 DIAGNOSIS — D1803 Hemangioma of intra-abdominal structures: Secondary | ICD-10-CM | POA: Diagnosis not present

## 2020-02-28 DIAGNOSIS — N281 Cyst of kidney, acquired: Secondary | ICD-10-CM | POA: Diagnosis not present

## 2020-02-28 DIAGNOSIS — D251 Intramural leiomyoma of uterus: Secondary | ICD-10-CM | POA: Diagnosis not present

## 2020-02-28 DIAGNOSIS — R109 Unspecified abdominal pain: Secondary | ICD-10-CM | POA: Diagnosis not present

## 2020-04-03 IMAGING — MG DIGITAL SCREENING BILATERAL MAMMOGRAM WITH TOMO AND CAD
8 series · 8 of 24 positions shown · non-contrast
Comparison: Previous exam(s).
COMPARISON: Previous exam(s).
COMPARISON: Previous exam(s).
COMPARISON: Previous exam(s).
COMPARISON: Previous exam(s).

Addendum:
CLINICAL DATA: Screening.

EXAM:
DIGITAL SCREENING BILATERAL MAMMOGRAM WITH TOMO AND CAD

[L MLO synth-2D]
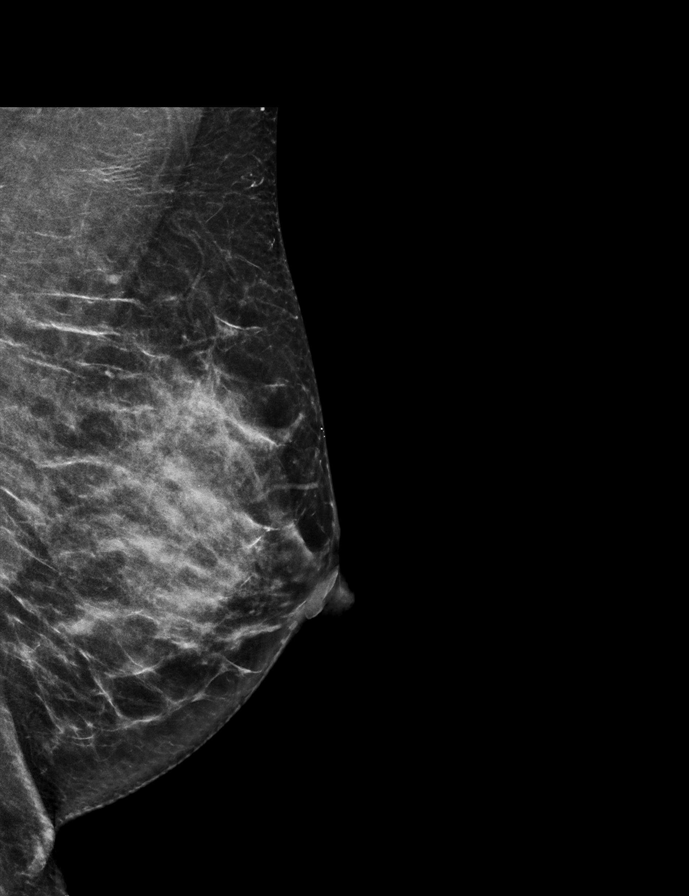

[R CC synth-2D]
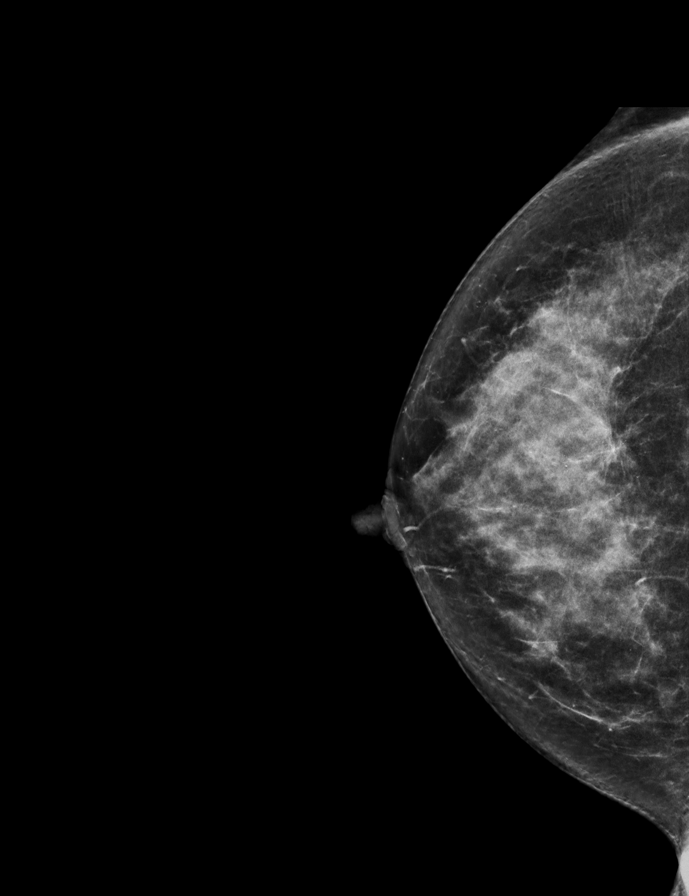

[L CC synth-2D]
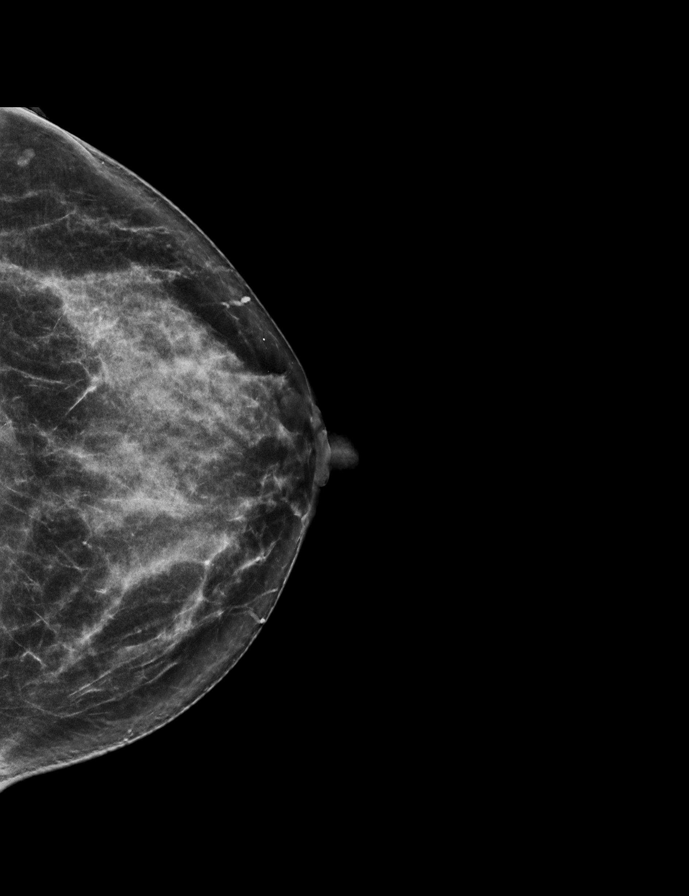

[R MLO synth-2D]
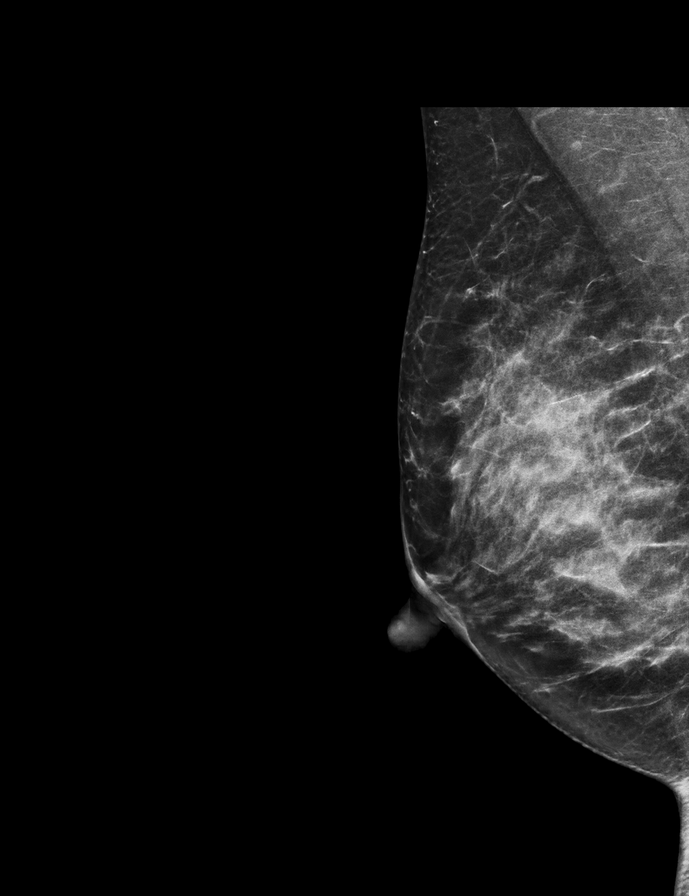

[L CC tomo · tomo slice 33/65.0]
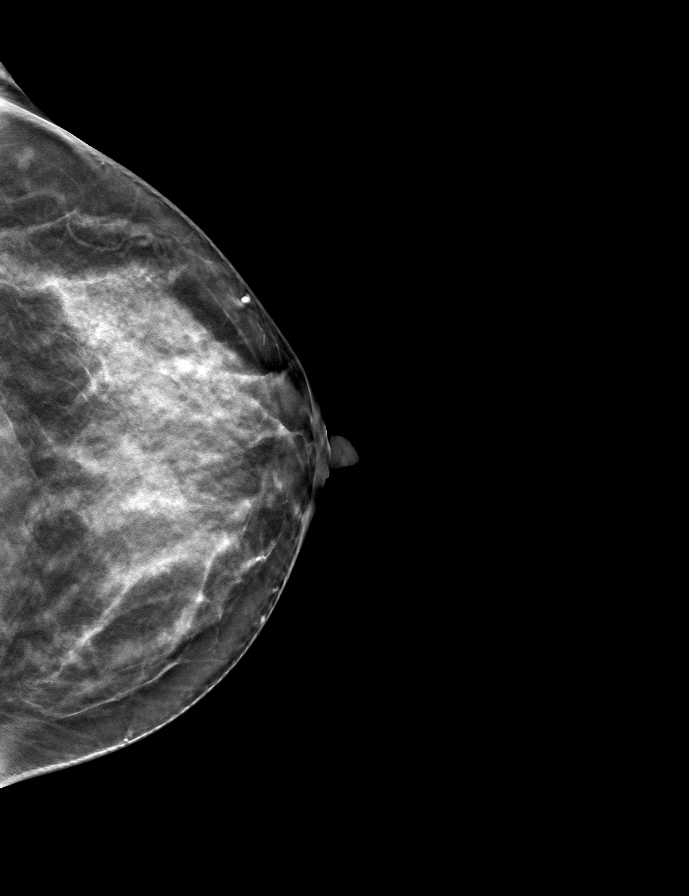

[R MLO tomo · tomo slice 31/62.0]
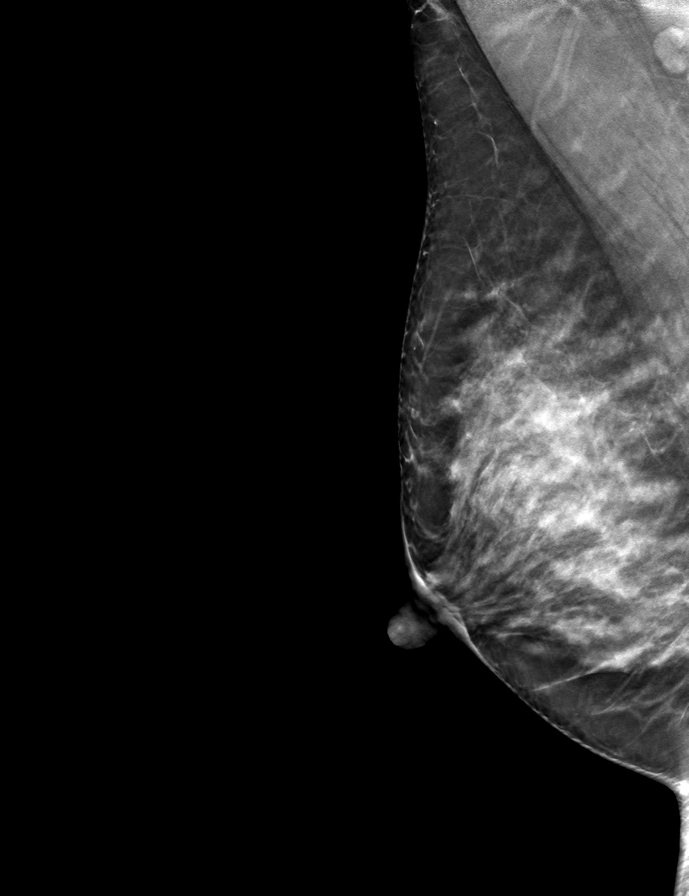

[L MLO tomo · tomo slice 32/63.0]
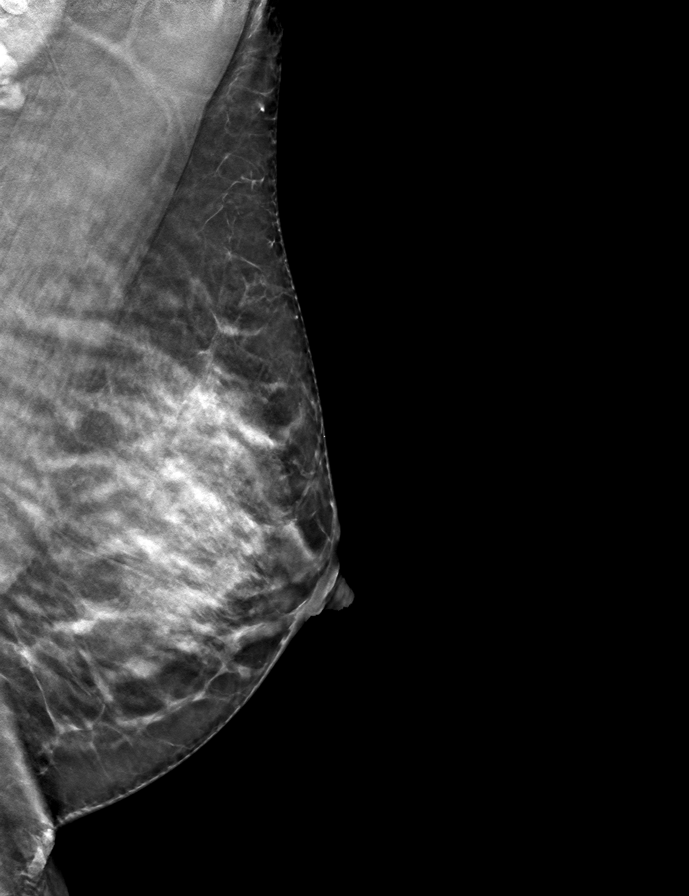

[R CC tomo · tomo slice 33/66.0]
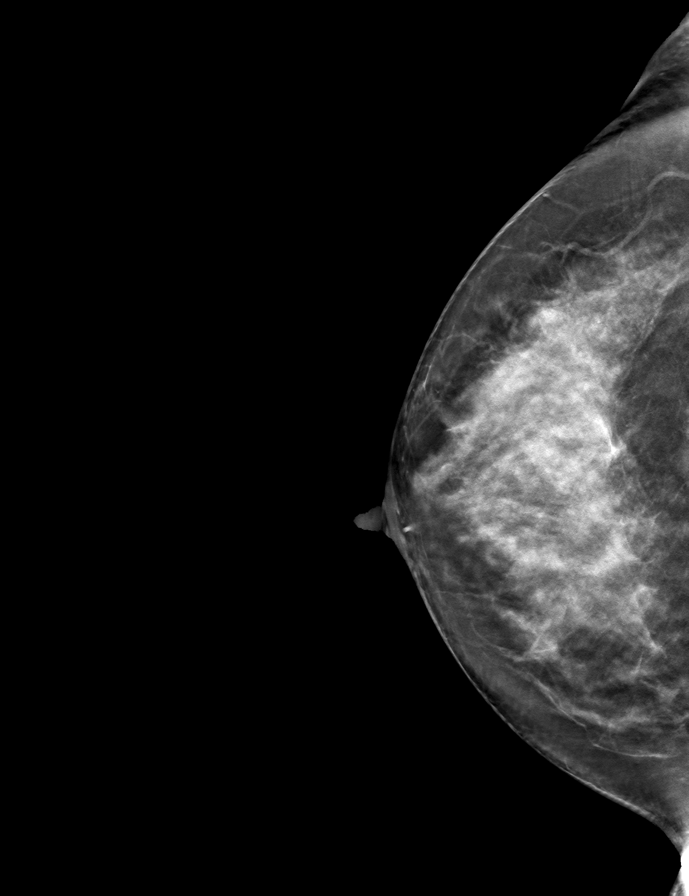

[8 of 24 positions shown; findings below may reference images not displayed]

ACR Breast Density Category d: The breast tissue is extremely dense,
which lowers the sensitivity of mammography
FINDINGS: There are no findings suspicious for malignancy. Images were
processed with CAD.
IMPRESSION: No mammographic evidence of malignancy. A result letter of this
screening mammogram will be mailed directly to the patient.

RECOMMENDATION:
Screening mammogram in one year. (Code:WN-S-WOO)

BI-RADS CATEGORY  1: Negative.

ADDENDUM:
Note that the incorrect report was initially dictated, with the
corrected report below:
ACR Breast Density Category d: The breast tissue is extremely dense,
which lowers the sensitivity of mammography.
FINDINGS: In the right breast calcifications requires further evaluation.

In the left breast calcifications requires further evaluation.

Images were processed with CAD.
IMPRESSION: Further evaluation is suggested for possible calcifications in the
right breast.

Further evaluation is suggested for possible calcifications in the
left breast.

RECOMMENDATION:
Diagnostic mammogram and possibly ultrasound of both breasts.
(Code:B8-7-DDS)

The patient will be contacted regarding the findings, and additional
imaging will be scheduled.

BI-RADS CATEGORY  0: Incomplete. Need additional imaging evaluation
and/or prior mammograms for comparison.

*** End of Addendum ***
Addendum:
ACR Breast Density Category d: The breast tissue is extremely dense,
which lowers the sensitivity of mammography
FINDINGS: There are no findings suspicious for malignancy. Images were
processed with CAD.
IMPRESSION: No mammographic evidence of malignancy. A result letter of this
screening mammogram will be mailed directly to the patient.

RECOMMENDATION:
Screening mammogram in one year. (Code:WN-S-WOO)

BI-RADS CATEGORY  1: Negative.

ADDENDUM:
Note that the incorrect report was initially dictated, with the
corrected report below:
ACR Breast Density Category d: The breast tissue is extremely dense,
which lowers the sensitivity of mammography.
FINDINGS: In the right breast calcifications requires further evaluation.

In the left breast calcifications requires further evaluation.

Images were processed with CAD.
IMPRESSION: Further evaluation is suggested for possible calcifications in the
right breast.

Further evaluation is suggested for possible calcifications in the
left breast.

RECOMMENDATION:
Diagnostic mammogram and possibly ultrasound of both breasts.
(Code:B8-7-DDS)

The patient will be contacted regarding the findings, and additional
imaging will be scheduled.

BI-RADS CATEGORY  0: Incomplete. Need additional imaging evaluation
and/or prior mammograms for comparison.

*** End of Addendum ***
ACR Breast Density Category d: The breast tissue is extremely dense,
which lowers the sensitivity of mammography
FINDINGS: There are no findings suspicious for malignancy. Images were
processed with CAD.
IMPRESSION: No mammographic evidence of malignancy. A result letter of this
screening mammogram will be mailed directly to the patient.

RECOMMENDATION:
Screening mammogram in one year. (Code:WN-S-WOO)

BI-RADS CATEGORY  1: Negative.

## 2020-05-07 DIAGNOSIS — Z01818 Encounter for other preprocedural examination: Secondary | ICD-10-CM | POA: Diagnosis not present

## 2020-05-09 DIAGNOSIS — N764 Abscess of vulva: Secondary | ICD-10-CM | POA: Diagnosis not present

## 2020-06-07 ENCOUNTER — Encounter: Payer: Self-pay | Admitting: Obstetrics & Gynecology

## 2020-06-07 DIAGNOSIS — Z1231 Encounter for screening mammogram for malignant neoplasm of breast: Secondary | ICD-10-CM | POA: Diagnosis not present

## 2020-08-24 ENCOUNTER — Telehealth (HOSPITAL_BASED_OUTPATIENT_CLINIC_OR_DEPARTMENT_OTHER): Payer: Self-pay

## 2020-08-24 NOTE — Telephone Encounter (Signed)
Patient called today and states she saw you last in the old office for an annual. At that visit you switched her to a different birth control for her to try. She states that she absolutely hates it. Before her periods would last about 3-4 days, on this new birth control she is bleeding every other week. She is wanting to know if she could be switched back to her old medication. Tried contacting patient without success to verify what she is taking. Looking in the chart it looks like patient is currently on Micronor. Please advise.

## 2020-08-27 NOTE — Telephone Encounter (Signed)
DOB verified. Called pt to confirm desire to switch birth control pills. She states that she only has 5-6 days out of the month where she is not bleeding. She states that her hematologist felt it was okay for her to take a estrogen/progesterone combination pill; that her past DVT was felt to be circumstantial. She is willing to try other pills if provider has another recommendation and states that she has been on the pill for 20+ years and this has been the worst one she has experienced. I advised that Dr. Hyacinth Meeker had suggested doing an ultrasound in the past if irregular bleeding continued. Pt states that she is willing to do that as well. She states that she had a complete MRI done in January if Dr. Hyacinth Meeker would like to review that. Advised that I would send her request to Dr. Hyacinth Meeker. Pt verbalized understanding.

## 2020-08-28 ENCOUNTER — Other Ambulatory Visit (HOSPITAL_BASED_OUTPATIENT_CLINIC_OR_DEPARTMENT_OTHER): Payer: Self-pay | Admitting: Obstetrics & Gynecology

## 2020-08-28 ENCOUNTER — Telehealth (HOSPITAL_BASED_OUTPATIENT_CLINIC_OR_DEPARTMENT_OTHER): Payer: Self-pay

## 2020-08-28 MED ORDER — LO LOESTRIN FE 1 MG-10 MCG / 10 MCG PO TABS: 1.0000 | ORAL_TABLET | Freq: Every day | ORAL | 1 refills | Status: DC

## 2020-08-28 NOTE — Telephone Encounter (Signed)
Called and spoke with patient about lab results and recommendations. Patient would like for you to give her a call. She states that she is so frustrated with all the things that are going on with her. She said the vulvar excision did not go well at all. She is currently having problems with that. She is speaking with the doctors nurse who performed that procedure to get an appointment with him. tbw

## 2020-08-31 NOTE — Telephone Encounter (Signed)
Called pt personally.  She has follow up with Dr. Delaine Lame at [facility] on Friday 09/07/2020.  Area has abscessed again and she is so frustrated.  Surgery did not fix the issue.  She is going to stop micronor and restart OCP.  H/o DVT but was cleared with hematology to take OCP.  She does know I have some concerns about this and we discussed this again today.  Pt may just stop progesterone only pill and see what happens with cycle.  She will let me know about follow up and what she decides.  Ok to close encounter.

## 2020-09-07 DIAGNOSIS — N764 Abscess of vulva: Secondary | ICD-10-CM | POA: Diagnosis not present

## 2020-09-07 DIAGNOSIS — Z6824 Body mass index (BMI) 24.0-24.9, adult: Secondary | ICD-10-CM | POA: Diagnosis not present

## 2020-10-19 DIAGNOSIS — L988 Other specified disorders of the skin and subcutaneous tissue: Secondary | ICD-10-CM | POA: Diagnosis not present

## 2021-01-04 ENCOUNTER — Other Ambulatory Visit: Payer: Self-pay | Admitting: Obstetrics & Gynecology

## 2021-01-04 NOTE — Telephone Encounter (Signed)
LMOVM for pt call office regarding refill request

## 2021-01-07 NOTE — Telephone Encounter (Signed)
LMOVM for pt to call regarding refill request 

## 2021-01-08 ENCOUNTER — Telehealth (HOSPITAL_BASED_OUTPATIENT_CLINIC_OR_DEPARTMENT_OTHER): Payer: Self-pay | Admitting: Obstetrics & Gynecology

## 2021-01-08 NOTE — Telephone Encounter (Signed)
Patient called today said someone keeps calling her about a refill and she do not need a refill. She will only need a refill on her low estrogen and she don't need that now.

## 2021-01-25 DIAGNOSIS — K603 Anal fistula: Secondary | ICD-10-CM | POA: Diagnosis not present

## 2021-02-18 ENCOUNTER — Other Ambulatory Visit (HOSPITAL_BASED_OUTPATIENT_CLINIC_OR_DEPARTMENT_OTHER): Payer: Self-pay | Admitting: Obstetrics & Gynecology

## 2021-02-18 MED ORDER — LO LOESTRIN FE 1 MG-10 MCG / 10 MCG PO TABS: 1.0000 | ORAL_TABLET | Freq: Every day | ORAL | 0 refills | Status: DC

## 2021-03-05 DIAGNOSIS — Z1211 Encounter for screening for malignant neoplasm of colon: Secondary | ICD-10-CM | POA: Diagnosis not present

## 2021-03-05 DIAGNOSIS — L723 Sebaceous cyst: Secondary | ICD-10-CM | POA: Diagnosis not present

## 2021-03-05 DIAGNOSIS — K603 Anal fistula, unspecified: Secondary | ICD-10-CM | POA: Insufficient documentation

## 2021-03-05 DIAGNOSIS — T783XXA Angioneurotic edema, initial encounter: Secondary | ICD-10-CM | POA: Insufficient documentation

## 2021-04-04 ENCOUNTER — Ambulatory Visit (HOSPITAL_BASED_OUTPATIENT_CLINIC_OR_DEPARTMENT_OTHER): Payer: BC Managed Care – PPO | Admitting: Obstetrics & Gynecology

## 2021-05-23 ENCOUNTER — Ambulatory Visit (HOSPITAL_BASED_OUTPATIENT_CLINIC_OR_DEPARTMENT_OTHER): Payer: BC Managed Care – PPO | Admitting: Obstetrics & Gynecology

## 2021-06-03 ENCOUNTER — Other Ambulatory Visit (HOSPITAL_BASED_OUTPATIENT_CLINIC_OR_DEPARTMENT_OTHER): Payer: Self-pay | Admitting: *Deleted

## 2021-06-03 ENCOUNTER — Other Ambulatory Visit: Payer: Self-pay | Admitting: Obstetrics & Gynecology

## 2021-06-03 MED ORDER — LO LOESTRIN FE 1 MG-10 MCG / 10 MCG PO TABS: 1.0000 | ORAL_TABLET | Freq: Every day | ORAL | 0 refills | Status: DC

## 2021-06-07 ENCOUNTER — Ambulatory Visit (HOSPITAL_BASED_OUTPATIENT_CLINIC_OR_DEPARTMENT_OTHER): Payer: BC Managed Care – PPO | Admitting: Obstetrics & Gynecology

## 2021-06-19 DIAGNOSIS — Z1231 Encounter for screening mammogram for malignant neoplasm of breast: Secondary | ICD-10-CM | POA: Diagnosis not present

## 2021-07-05 ENCOUNTER — Ambulatory Visit (INDEPENDENT_AMBULATORY_CARE_PROVIDER_SITE_OTHER): Payer: BC Managed Care – PPO | Admitting: Obstetrics & Gynecology

## 2021-07-05 ENCOUNTER — Encounter (HOSPITAL_BASED_OUTPATIENT_CLINIC_OR_DEPARTMENT_OTHER): Payer: Self-pay | Admitting: Obstetrics & Gynecology

## 2021-07-05 ENCOUNTER — Other Ambulatory Visit: Payer: Self-pay

## 2021-07-05 VITALS — BP 138/77 | HR 69 | Ht 64.0 in | Wt 159.8 lb

## 2021-07-05 DIAGNOSIS — Z86718 Personal history of other venous thrombosis and embolism: Secondary | ICD-10-CM | POA: Diagnosis not present

## 2021-07-05 DIAGNOSIS — T783XXS Angioneurotic edema, sequela: Secondary | ICD-10-CM

## 2021-07-05 DIAGNOSIS — Z3041 Encounter for surveillance of contraceptive pills: Secondary | ICD-10-CM | POA: Diagnosis not present

## 2021-07-05 DIAGNOSIS — N764 Abscess of vulva: Secondary | ICD-10-CM

## 2021-07-05 DIAGNOSIS — Z0000 Encounter for general adult medical examination without abnormal findings: Secondary | ICD-10-CM | POA: Diagnosis not present

## 2021-07-05 DIAGNOSIS — I824Y9 Acute embolism and thrombosis of unspecified deep veins of unspecified proximal lower extremity: Secondary | ICD-10-CM

## 2021-07-05 DIAGNOSIS — K603 Anal fistula: Secondary | ICD-10-CM

## 2021-07-05 DIAGNOSIS — Z01419 Encounter for gynecological examination (general) (routine) without abnormal findings: Secondary | ICD-10-CM

## 2021-07-05 MED ORDER — LO LOESTRIN FE 1 MG-10 MCG / 10 MCG PO TABS: 1.0000 | ORAL_TABLET | Freq: Every day | ORAL | 4 refills | Status: DC

## 2021-07-05 NOTE — Progress Notes (Signed)
51 y.o. G61P1102 Married White or Caucasian female here for annual exam.  Has undergone two procedures/surgeries for treatment of recurrent vulvar abscess.  This ended up being a fistula.  Has finally healed and she is not longer having issues after almost 10 years.  Had flu around this time.  Had angio-edema that occurred with flu.   ? ?H/o DVT after cesarean section.  Pt did see Dr. Myna Hidalgo about whether it was safe to be on OCPs.  She's had a full anticoagulation w/u and it was negative.  Cycles are changing some and some definitely lighter.        ? ?  ?The current method of family planning is OCP (estrogen/progesterone).    ?Smoker:  no ? ?Health Maintenance: ?Pap:  01/26/2020 Negative ?History of abnormal Pap:  no ?MMG:  06/07/2020 Negative, just did 2023 one ?Colonoscopy:  11/06/2014, follow up age 61.  Hyperplastic polyps seen. ?BMD:   none ?Screening Labs: today ? ? reports that she has never smoked. She has never used smokeless tobacco. She reports current alcohol use of about 2.0 standard drinks per week. She reports that she does not use drugs. ? ?Past Medical History:  ?Diagnosis Date  ? Chicken pox as a child  ? DVT (deep venous thrombosis) (HCC)   ? right after C section  ? Other and unspecified hyperlipidemia 08/04/2012  ? Pre-eclampsia at 26 weeks  ? Preventative health care 08/04/2012  ? Previous pregnancy with HELLP syndrome, antepartum 08/04/2012  ? Varicose veins 08/04/2012  ? Vulvar abscess 08/04/2012  ? ? ?Past Surgical History:  ?Procedure Laterality Date  ? CESAREAN SECTION  06-25-03  ? VARICOSE VEIN SURGERY Left   ? WISDOM TOOTH EXTRACTION  early 20's  ? ? ?Current Outpatient Medications  ?Medication Sig Dispense Refill  ? Norethindrone-Ethinyl Estradiol-Fe Biphas (LO LOESTRIN FE) 1 MG-10 MCG / 10 MCG tablet Take 1 tablet by mouth daily. 84 tablet 0  ? Multiple Vitamin (MULTIVITAMIN) tablet Take 1 tablet by mouth daily. (Patient not taking: Reported on 07/05/2021)    ? ?No current facility-administered  medications for this visit.  ? ? ?Family History  ?Problem Relation Age of Onset  ? Dementia Maternal Grandmother   ?     late age  ? Alzheimer's disease Maternal Grandfather   ? ? ?Review of Systems  ?All other systems reviewed and are negative. ? ?Exam:   ?BP 138/77 (BP Location: Right Arm, Patient Position: Sitting, Cuff Size: Large)   Pulse 69   Ht 5\' 4"  (1.626 m) Comment: Reported  Wt 159 lb 12.8 oz (72.5 kg)   LMP 06/26/2021   BMI 27.43 kg/m?   Height: 5\' 4"  (162.6 cm) (Reported) ? ?General appearance: alert, cooperative and appears stated age ?Head: Normocephalic, without obvious abnormality, atraumatic ?Neck: no adenopathy, supple, symmetrical, trachea midline and thyroid normal to inspection and palpation ?Lungs: clear to auscultation bilaterally ?Breasts: normal appearance, no masses or tenderness ?Heart: regular rate and rhythm ?Abdomen: soft, non-tender; bowel sounds normal; no masses,  no organomegaly ?Extremities: extremities normal, atraumatic, no cyanosis or edema ?Skin: Skin color, texture, turgor normal. No rashes or lesions ?Lymph nodes: Cervical, supraclavicular, and axillary nodes normal. ?No abnormal inguinal nodes palpated ?Neurologic: Grossly normal ? ? ?Pelvic: External genitalia:  no lesions ?             Urethra:  normal appearing urethra with no masses, tenderness or lesions ?             Bartholins and Skenes:  normal    ?             Vagina: normal appearing vagina with normal color and no discharge, no lesions ?             Cervix: no lesions ?             Pap taken: No. ?Bimanual Exam:  Uterus:  normal size, contour, position, consistency, mobility, non-tender ?             Adnexa: normal adnexa and no mass, fullness, tenderness ?              Rectovaginal: Confirms ?              Anus:  normal sphincter tone, no lesions ? ?Chaperone, Ina Homes, CMA, was present for exam. ? ?Assessment/Plan: ?1. Well woman exam with routine gynecological exam ?- pap neg with neg HR HPV 2021.   Not indicated today ?- pt just did MMG last week ?- colonoscopy 2016 ?- lab work ordered for today ?- vaccines reviewed/updated ? ?2. Blood tests for routine general physical examination ?- Hemoglobin A1c ?- TSH ?- Lipid panel ? ?3. Vulvar abscess ?- s/p resection of fistula ? ?4. Anal fistula ? ?5. Angioedema, sequela ? ?6. Contraception ?- RF for loloestrin to pharmacy ? ?7.  H/o DVT ?- cleared by Dr. Myna Hidalgo to take OCPs. ? ? ? ?

## 2021-07-06 LAB — LIPID PANEL
Chol/HDL Ratio: 2.3 ratio (ref 0.0–4.4)
Cholesterol, Total: 221 mg/dL — ABNORMAL HIGH (ref 100–199)
HDL: 98 mg/dL (ref >39)
LDL Chol Calc (NIH): 111 mg/dL — ABNORMAL HIGH (ref 0–99)
Triglycerides: 70 mg/dL (ref 0–149)
VLDL Cholesterol Cal: 12 mg/dL (ref 5–40)

## 2021-07-06 LAB — HEMOGLOBIN A1C
Est. average glucose Bld gHb Est-mCnc: 105 mg/dL
Hgb A1c MFr Bld: 5.3 % (ref 4.8–5.6)

## 2021-07-06 LAB — TSH: TSH: 1.61 u[IU]/mL (ref 0.450–4.500)

## 2021-08-22 DIAGNOSIS — L720 Epidermal cyst: Secondary | ICD-10-CM | POA: Diagnosis not present

## 2021-08-22 DIAGNOSIS — D239 Other benign neoplasm of skin, unspecified: Secondary | ICD-10-CM | POA: Diagnosis not present

## 2022-07-16 ENCOUNTER — Other Ambulatory Visit (HOSPITAL_BASED_OUTPATIENT_CLINIC_OR_DEPARTMENT_OTHER): Payer: Self-pay | Admitting: *Deleted

## 2022-07-16 MED ORDER — LO LOESTRIN FE 1 MG-10 MCG / 10 MCG PO TABS: 1.0000 | ORAL_TABLET | Freq: Every day | ORAL | 0 refills | Status: DC

## 2022-07-16 NOTE — Progress Notes (Signed)
Pt called requesting refill on Lo Loestrin. Pt has appt in May. Refill sent.

## 2022-09-04 ENCOUNTER — Ambulatory Visit (HOSPITAL_BASED_OUTPATIENT_CLINIC_OR_DEPARTMENT_OTHER): Payer: BC Managed Care – PPO | Admitting: Obstetrics & Gynecology

## 2022-10-09 ENCOUNTER — Other Ambulatory Visit (HOSPITAL_BASED_OUTPATIENT_CLINIC_OR_DEPARTMENT_OTHER): Payer: Self-pay | Admitting: *Deleted

## 2022-10-09 MED ORDER — LO LOESTRIN FE 1 MG-10 MCG / 10 MCG PO TABS: 1.0000 | ORAL_TABLET | Freq: Every day | ORAL | 0 refills | Status: DC

## 2022-10-09 NOTE — Progress Notes (Signed)
Pt called requesting refill on Lo Loestrin. She has appt on 7/11 with Dr. Hyacinth Meeker. Refill sent to pharmacy.

## 2022-11-06 ENCOUNTER — Ambulatory Visit (HOSPITAL_BASED_OUTPATIENT_CLINIC_OR_DEPARTMENT_OTHER): Payer: BC Managed Care – PPO | Admitting: Obstetrics & Gynecology

## 2023-01-21 DIAGNOSIS — Z800 Family history of malignant neoplasm of digestive organs: Secondary | ICD-10-CM | POA: Diagnosis not present

## 2023-01-21 DIAGNOSIS — Z1211 Encounter for screening for malignant neoplasm of colon: Secondary | ICD-10-CM | POA: Diagnosis not present

## 2023-01-21 DIAGNOSIS — R635 Abnormal weight gain: Secondary | ICD-10-CM | POA: Diagnosis not present

## 2023-01-21 DIAGNOSIS — Z86010 Personal history of colonic polyps: Secondary | ICD-10-CM | POA: Diagnosis not present

## 2023-01-22 ENCOUNTER — Encounter (HOSPITAL_BASED_OUTPATIENT_CLINIC_OR_DEPARTMENT_OTHER): Payer: Self-pay | Admitting: Obstetrics & Gynecology

## 2023-01-22 ENCOUNTER — Ambulatory Visit (HOSPITAL_BASED_OUTPATIENT_CLINIC_OR_DEPARTMENT_OTHER): Payer: BC Managed Care – PPO | Admitting: Obstetrics & Gynecology

## 2023-01-22 ENCOUNTER — Other Ambulatory Visit (HOSPITAL_COMMUNITY)
Admission: RE | Admit: 2023-01-22 | Discharge: 2023-01-22 | Disposition: A | Discharge status: Home / Self Care | Payer: BC Managed Care – PPO | Source: Ambulatory Visit | Attending: Obstetrics & Gynecology | Admitting: Obstetrics & Gynecology

## 2023-01-22 VITALS — BP 131/75 | HR 75 | Ht 64.0 in | Wt 167.8 lb

## 2023-01-22 DIAGNOSIS — Z3041 Encounter for surveillance of contraceptive pills: Secondary | ICD-10-CM | POA: Diagnosis not present

## 2023-01-22 DIAGNOSIS — N764 Abscess of vulva: Secondary | ICD-10-CM

## 2023-01-22 DIAGNOSIS — Z01419 Encounter for gynecological examination (general) (routine) without abnormal findings: Secondary | ICD-10-CM

## 2023-01-22 DIAGNOSIS — Z124 Encounter for screening for malignant neoplasm of cervix: Secondary | ICD-10-CM

## 2023-01-22 DIAGNOSIS — I824Y9 Acute embolism and thrombosis of unspecified deep veins of unspecified proximal lower extremity: Secondary | ICD-10-CM | POA: Diagnosis not present

## 2023-01-22 MED ORDER — LO LOESTRIN FE 1 MG-10 MCG / 10 MCG PO TABS: 1.0000 | ORAL_TABLET | Freq: Every day | ORAL | 3 refills | Status: DC

## 2023-01-22 NOTE — Progress Notes (Signed)
52 y.o. G43P1102 Married White or Caucasian female here for annual exam.  Did skip cycles for three months this year.  Has had two cycles now in a row.  Flow lasts 5 days.    Patient's last menstrual period was 01/01/2023 (approximate).          Sexually active: Yes.    The current method of family planning is OCP (estrogen/progesterone).     Upstream - 01/22/23 1430       Pregnancy Intention Screening   Does the patient want to become pregnant in the next year? No    Does the patient's partner want to become pregnant in the next year? No    Would the patient like to discuss contraceptive options today? No      Contraception Wrap Up   Current Method Oral Contraceptive            Health Maintenance: Pap:  2021 History of abnormal Pap:  no MMG:  05/2020 Colonoscopy:  2016.  Has appt scheduled 01/2023 Screening Labs: done recently and brought copies   reports that she has never smoked. She has never used smokeless tobacco. She reports current alcohol use of about 2.0 standard drinks of alcohol per week. She reports that she does not use drugs.  Past Medical History:  Diagnosis Date   Chicken pox as a child   DVT (deep venous thrombosis) (HCC)    right after C section   Other and unspecified hyperlipidemia 08/04/2012   Pre-eclampsia at 26 weeks   Preventative health care 08/04/2012   Previous pregnancy with HELLP syndrome, antepartum 08/04/2012   Varicose veins 08/04/2012   Vulvar abscess 08/04/2012    Past Surgical History:  Procedure Laterality Date   CESAREAN SECTION  06-25-03   VARICOSE VEIN SURGERY Left    WISDOM TOOTH EXTRACTION  early 20's    Current Outpatient Medications  Medication Sig Dispense Refill   Multiple Vitamin (MULTIVITAMIN) tablet Take 1 tablet by mouth daily.     Norethindrone-Ethinyl Estradiol-Fe Biphas (LO LOESTRIN FE) 1 MG-10 MCG / 10 MCG tablet Take 1 tablet by mouth daily. 84 tablet 0   No current facility-administered medications for this visit.     Family History  Problem Relation Age of Onset   Dementia Maternal Grandmother        late age   Alzheimer's disease Maternal Grandfather     ROS: Constitutional: negative Genitourinary:negative  Exam:   BP 131/75 (BP Location: Left Arm, Patient Position: Sitting, Cuff Size: Normal)   Pulse 75   Ht 5\' 4"  (1.626 m)   Wt 167 lb 12.8 oz (76.1 kg)   LMP 01/01/2023 (Approximate)   BMI 28.80 kg/m   Height: 5\' 4"  (162.6 cm)  General appearance: alert, cooperative and appears stated age Head: Normocephalic, without obvious abnormality, atraumatic Neck: no adenopathy, supple, symmetrical, trachea midline and thyroid normal to inspection and palpation Lungs: clear to auscultation bilaterally Breasts: normal appearance, no masses or tenderness Heart: regular rate and rhythm Abdomen: soft, non-tender; bowel sounds normal; no masses,  no organomegaly Extremities: extremities normal, atraumatic, no cyanosis or edema Skin: Skin color, texture, turgor normal. No rashes or lesions Lymph nodes: Cervical, supraclavicular, and axillary nodes normal. No abnormal inguinal nodes palpated Neurologic: Grossly normal   Pelvic: External genitalia:  no lesions              Urethra:  normal appearing urethra with no masses, tenderness or lesions  Bartholins and Skenes: normal                 Vagina: normal appearing vagina with normal color and no discharge, no lesions              Cervix: no lesions              Pap taken: Yes.   Bimanual Exam:  Uterus:  normal size, contour, position, consistency, mobility, non-tender              Adnexa: normal adnexa and no mass, fullness, tenderness               Rectovaginal: Confirms               Anus:  normal sphincter tone, no lesions  Chaperone, Ina Homes, CMA, was present for exam.  Assessment/Plan: 1. Well woman exam with routine gynecological exam - Pap smear and HR HPV obtained today - Mammogram 2022.  She is going to this  later this year.   - Colonoscopy scheduled for October - lab work done with [facility] in [place] - vaccines reviewed/updated  2. Cervical cancer screening - Cytology - PAP( [place])  3. Encounter for surveillance of contraceptive pills - Norethindrone-Ethinyl Estradiol-Fe Biphas (LO LOESTRIN FE) 1 MG-10 MCG / 10 MCG tablet; Take 1 tablet by mouth daily.  Dispense: 84 tablet; Refill: 3  4. Deep vein thrombosis (DVT) of proximal lower extremity, unspecified chronicity, unspecified laterality (HCC) - was cleared by Dr. Myna Hidalgo, heme/onc to take OCPs.  Discussed with pt switching to POP this year.  She does not desire to make any changes.  5. Vulvar abscess - resolved finally after 01/25/2021

## 2023-01-28 LAB — CYTOLOGY - PAP
Adequacy: ABSENT
Comment: NEGATIVE
Diagnosis: NEGATIVE
High risk HPV: NEGATIVE

## 2023-02-09 MED ORDER — ALBUTEROL SULFATE (2.5 MG/3ML) 0.083% IN NEBU
INHALATION_SOLUTION | RESPIRATORY_TRACT | Status: AC
  Filled 2023-02-09: qty 6

## 2023-02-11 ENCOUNTER — Encounter (HOSPITAL_BASED_OUTPATIENT_CLINIC_OR_DEPARTMENT_OTHER): Payer: Self-pay | Admitting: *Deleted

## 2023-02-12 DIAGNOSIS — Z1231 Encounter for screening mammogram for malignant neoplasm of breast: Secondary | ICD-10-CM | POA: Diagnosis not present

## 2023-06-12 DIAGNOSIS — H10022 Other mucopurulent conjunctivitis, left eye: Secondary | ICD-10-CM | POA: Diagnosis not present

## 2023-08-17 ENCOUNTER — Other Ambulatory Visit (HOSPITAL_BASED_OUTPATIENT_CLINIC_OR_DEPARTMENT_OTHER): Payer: Self-pay | Admitting: Obstetrics & Gynecology

## 2023-08-17 DIAGNOSIS — Z3041 Encounter for surveillance of contraceptive pills: Secondary | ICD-10-CM

## 2023-08-17 NOTE — Telephone Encounter (Signed)
 LMOM at 4:34 for patient to call the office. Patient will need to call insurance company to find out what the covered alternatives are for the request medication. tbw

## 2023-08-20 ENCOUNTER — Other Ambulatory Visit (HOSPITAL_BASED_OUTPATIENT_CLINIC_OR_DEPARTMENT_OTHER): Payer: Self-pay | Admitting: Obstetrics & Gynecology

## 2023-08-20 DIAGNOSIS — Z3041 Encounter for surveillance of contraceptive pills: Secondary | ICD-10-CM

## 2023-08-20 MED ORDER — LO LOESTRIN FE 1 MG-10 MCG / 10 MCG PO TABS: 1.0000 | ORAL_TABLET | Freq: Every day | ORAL | 3 refills | Status: DC

## 2023-08-20 NOTE — Progress Notes (Signed)
 Called pt regarding prescription for loloestrin.  Insurance not covering this now.  Options include higher estrogen dosed medications.  She is aware I feel uncomfortable increasing the dosage with hx of DVT (after c section).  She did see Dr. Maria Shiner who felt comfortable with her on low dosed E containing OCPs but recommended ASA.  She is currently not taking the ASA.  Has been on a POP in the past with a lot of irregular bleeding.  Will try sending this to My Scripts to see about cost for this.  Pt comfortable with this plan.  She has hoped her husband would get a vasectomy for years.  Also, with hx of preeclampsia and HELLP she would like to see cardiology to see what current risks she has and what prevention she needs to consider.  Will place referral for provider at [facility] who does women's health cardiovascular care.

## 2023-08-21 ENCOUNTER — Other Ambulatory Visit (HOSPITAL_BASED_OUTPATIENT_CLINIC_OR_DEPARTMENT_OTHER): Payer: Self-pay | Admitting: *Deleted

## 2023-08-21 DIAGNOSIS — Z8759 Personal history of other complications of pregnancy, childbirth and the puerperium: Secondary | ICD-10-CM

## 2023-08-21 DIAGNOSIS — Z86718 Personal history of other venous thrombosis and embolism: Secondary | ICD-10-CM

## 2023-08-21 NOTE — Progress Notes (Signed)
 Referral placed and faxed to [facility]

## 2023-08-26 DIAGNOSIS — H16142 Punctate keratitis, left eye: Secondary | ICD-10-CM | POA: Diagnosis not present

## 2023-11-11 ENCOUNTER — Other Ambulatory Visit (HOSPITAL_BASED_OUTPATIENT_CLINIC_OR_DEPARTMENT_OTHER): Payer: Self-pay | Admitting: Obstetrics & Gynecology

## 2023-11-11 DIAGNOSIS — N6320 Unspecified lump in the left breast, unspecified quadrant: Secondary | ICD-10-CM

## 2023-11-18 ENCOUNTER — Telehealth (HOSPITAL_BASED_OUTPATIENT_CLINIC_OR_DEPARTMENT_OTHER): Payer: Self-pay | Admitting: Obstetrics & Gynecology

## 2023-11-18 ENCOUNTER — Encounter (HOSPITAL_BASED_OUTPATIENT_CLINIC_OR_DEPARTMENT_OTHER): Payer: Self-pay

## 2023-11-23 NOTE — Telephone Encounter (Signed)
 Left Message - Informed her to call the office back. Notified that the referral has been successfully sent to [facility]. Requested that she contact [phone] to schedule her appointment. I also left a message in [facility].

## 2023-12-22 ENCOUNTER — Other Ambulatory Visit (HOSPITAL_BASED_OUTPATIENT_CLINIC_OR_DEPARTMENT_OTHER): Payer: Self-pay | Admitting: Obstetrics & Gynecology

## 2023-12-22 ENCOUNTER — Telehealth (HOSPITAL_BASED_OUTPATIENT_CLINIC_OR_DEPARTMENT_OTHER): Payer: Self-pay | Admitting: Obstetrics & Gynecology

## 2023-12-22 DIAGNOSIS — N6320 Unspecified lump in the left breast, unspecified quadrant: Secondary | ICD-10-CM

## 2023-12-22 DIAGNOSIS — R928 Other abnormal and inconclusive findings on diagnostic imaging of breast: Secondary | ICD-10-CM

## 2023-12-22 DIAGNOSIS — N6324 Unspecified lump in the left breast, lower inner quadrant: Secondary | ICD-10-CM | POA: Diagnosis not present

## 2023-12-22 DIAGNOSIS — N6332 Unspecified lump in axillary tail of the left breast: Secondary | ICD-10-CM | POA: Diagnosis not present

## 2023-12-22 DIAGNOSIS — Z1231 Encounter for screening mammogram for malignant neoplasm of breast: Secondary | ICD-10-CM

## 2023-12-22 DIAGNOSIS — N6321 Unspecified lump in the left breast, upper outer quadrant: Secondary | ICD-10-CM | POA: Diagnosis not present

## 2023-12-22 NOTE — Telephone Encounter (Signed)
 Order for 3D bilateral diagnostic mammogram order placed and faxed to [facility] [phone] with confirmation.

## 2023-12-22 NOTE — Telephone Encounter (Signed)
 New message    [facility] P [phone], Fax: [phone] is requesting an order for a bilateral diagnostic mammogram.   The patient is due for her yearly mammogram in 7 weeks, but would like to have everything completed while she is there today.   Appt is at 2pm

## 2023-12-23 ENCOUNTER — Telehealth (HOSPITAL_BASED_OUTPATIENT_CLINIC_OR_DEPARTMENT_OTHER): Payer: Self-pay | Admitting: Obstetrics & Gynecology

## 2023-12-23 ENCOUNTER — Other Ambulatory Visit (HOSPITAL_BASED_OUTPATIENT_CLINIC_OR_DEPARTMENT_OTHER): Payer: Self-pay | Admitting: Obstetrics & Gynecology

## 2023-12-23 DIAGNOSIS — N6321 Unspecified lump in the left breast, upper outer quadrant: Secondary | ICD-10-CM

## 2023-12-23 NOTE — Telephone Encounter (Signed)
  New Message  Consuelo from [facility] called requesting an order for a left breast ultrasound-guided biopsy.   The appointment is scheduled for Friday, August 29, at 10:00 a.m.

## 2023-12-24 NOTE — Telephone Encounter (Signed)
 US  Lt breast bx w loc dev 1st lesion img bx spec us  guide sent to [facility] with confirmation on 12/22/23 to [phone].

## 2023-12-25 DIAGNOSIS — R590 Localized enlarged lymph nodes: Secondary | ICD-10-CM | POA: Diagnosis not present

## 2023-12-25 DIAGNOSIS — R928 Other abnormal and inconclusive findings on diagnostic imaging of breast: Secondary | ICD-10-CM | POA: Diagnosis not present

## 2023-12-25 DIAGNOSIS — C50812 Malignant neoplasm of overlapping sites of left female breast: Secondary | ICD-10-CM | POA: Insufficient documentation

## 2024-01-01 DIAGNOSIS — Z170 Estrogen receptor positive status [ER+]: Secondary | ICD-10-CM | POA: Diagnosis not present

## 2024-01-01 DIAGNOSIS — C50912 Malignant neoplasm of unspecified site of left female breast: Secondary | ICD-10-CM | POA: Diagnosis not present

## 2024-01-04 DIAGNOSIS — Z17411 Hormone receptor positive with human epidermal growth factor receptor 2 negative status: Secondary | ICD-10-CM | POA: Diagnosis not present

## 2024-01-04 DIAGNOSIS — C50412 Malignant neoplasm of upper-outer quadrant of left female breast: Secondary | ICD-10-CM | POA: Diagnosis not present

## 2024-01-04 DIAGNOSIS — C773 Secondary and unspecified malignant neoplasm of axilla and upper limb lymph nodes: Secondary | ICD-10-CM | POA: Diagnosis not present

## 2024-01-04 DIAGNOSIS — C50812 Malignant neoplasm of overlapping sites of left female breast: Secondary | ICD-10-CM | POA: Diagnosis not present

## 2024-01-05 DIAGNOSIS — C50912 Malignant neoplasm of unspecified site of left female breast: Secondary | ICD-10-CM | POA: Diagnosis not present

## 2024-01-06 DIAGNOSIS — C50812 Malignant neoplasm of overlapping sites of left female breast: Secondary | ICD-10-CM | POA: Diagnosis not present

## 2024-01-06 DIAGNOSIS — C773 Secondary and unspecified malignant neoplasm of axilla and upper limb lymph nodes: Secondary | ICD-10-CM | POA: Diagnosis not present

## 2024-01-06 DIAGNOSIS — R92333 Mammographic heterogeneous density, bilateral breasts: Secondary | ICD-10-CM | POA: Diagnosis not present

## 2024-01-08 DIAGNOSIS — D1803 Hemangioma of intra-abdominal structures: Secondary | ICD-10-CM | POA: Diagnosis not present

## 2024-01-08 DIAGNOSIS — C50812 Malignant neoplasm of overlapping sites of left female breast: Secondary | ICD-10-CM | POA: Diagnosis not present

## 2024-01-08 DIAGNOSIS — K5730 Diverticulosis of large intestine without perforation or abscess without bleeding: Secondary | ICD-10-CM | POA: Diagnosis not present

## 2024-01-11 DIAGNOSIS — C50812 Malignant neoplasm of overlapping sites of left female breast: Secondary | ICD-10-CM | POA: Diagnosis not present

## 2024-01-11 DIAGNOSIS — Z170 Estrogen receptor positive status [ER+]: Secondary | ICD-10-CM | POA: Diagnosis not present

## 2024-01-15 DIAGNOSIS — Z01810 Encounter for preprocedural cardiovascular examination: Secondary | ICD-10-CM | POA: Diagnosis not present

## 2024-01-15 DIAGNOSIS — R030 Elevated blood-pressure reading, without diagnosis of hypertension: Secondary | ICD-10-CM | POA: Diagnosis not present

## 2024-01-18 ENCOUNTER — Encounter (HOSPITAL_BASED_OUTPATIENT_CLINIC_OR_DEPARTMENT_OTHER): Payer: Self-pay | Admitting: Obstetrics & Gynecology

## 2024-01-18 DIAGNOSIS — R0609 Other forms of dyspnea: Secondary | ICD-10-CM | POA: Diagnosis not present

## 2024-01-19 ENCOUNTER — Telehealth (HOSPITAL_BASED_OUTPATIENT_CLINIC_OR_DEPARTMENT_OTHER): Payer: Self-pay

## 2024-01-19 DIAGNOSIS — N6021 Fibroadenosis of right breast: Secondary | ICD-10-CM | POA: Diagnosis not present

## 2024-01-19 DIAGNOSIS — C50912 Malignant neoplasm of unspecified site of left female breast: Secondary | ICD-10-CM | POA: Diagnosis not present

## 2024-01-19 DIAGNOSIS — N6001 Solitary cyst of right breast: Secondary | ICD-10-CM | POA: Diagnosis not present

## 2024-01-19 DIAGNOSIS — R928 Other abnormal and inconclusive findings on diagnostic imaging of breast: Secondary | ICD-10-CM | POA: Diagnosis not present

## 2024-01-19 DIAGNOSIS — Z17410 Hormone receptor positive with human epidermal growth factor receptor 2 positive status: Secondary | ICD-10-CM | POA: Diagnosis not present

## 2024-01-19 DIAGNOSIS — N6489 Other specified disorders of breast: Secondary | ICD-10-CM | POA: Diagnosis not present

## 2024-01-19 DIAGNOSIS — N6031 Fibrosclerosis of right breast: Secondary | ICD-10-CM | POA: Diagnosis not present

## 2024-01-19 NOTE — Telephone Encounter (Signed)
 Spoke with patient. Advised of message as seen below from Dr.Miller. Patient verbalizes understanding. Patient will reach out to our office if she needs a referral.

## 2024-01-19 NOTE — Telephone Encounter (Signed)
-----   Message from Ronal GORMAN Pinal sent at 01/19/2024  8:11 AM EDT ----- Regarding: breast surgeon names at [facility] These are the names of the breast surgeons at [facility].  I spoke with Dr. Ebbie last night about this and he thinks these two surgeons are really good.  Drs. Jennifer Plitchta and Maggie DiNome.  Thanks.  [facility]

## 2024-01-25 ENCOUNTER — Telehealth (HOSPITAL_BASED_OUTPATIENT_CLINIC_OR_DEPARTMENT_OTHER): Payer: Self-pay

## 2024-01-25 DIAGNOSIS — Z170 Estrogen receptor positive status [ER+]: Secondary | ICD-10-CM | POA: Diagnosis not present

## 2024-01-25 DIAGNOSIS — Z86718 Personal history of other venous thrombosis and embolism: Secondary | ICD-10-CM | POA: Diagnosis not present

## 2024-01-25 DIAGNOSIS — C50812 Malignant neoplasm of overlapping sites of left female breast: Secondary | ICD-10-CM | POA: Diagnosis not present

## 2024-01-25 DIAGNOSIS — Z79899 Other long term (current) drug therapy: Secondary | ICD-10-CM | POA: Diagnosis not present

## 2024-01-25 NOTE — Telephone Encounter (Signed)
 Spoke with patient. Patient states that she was able to get her appointment scheduled without a referral. Patient is scheduled to see Dr.DiNome on 02/01/24 along with Dr.Ayala-Peacock (radiology/oncology). Is scheduled to see Dr.Telloni (med onc) on 02/03/24. Advised I will let Dr.Miller know. Patient is agreeable.

## 2024-01-25 NOTE — Telephone Encounter (Signed)
 Pt called and stated that she has had a breast cancer diagnoses and she wants a second opinion. Pt states that Dr. Cleotilde suggested a few providers, pt would like a referral. Can someone please give her a call.

## 2024-01-27 DIAGNOSIS — C50812 Malignant neoplasm of overlapping sites of left female breast: Secondary | ICD-10-CM | POA: Diagnosis not present

## 2024-01-27 DIAGNOSIS — Z452 Encounter for adjustment and management of vascular access device: Secondary | ICD-10-CM | POA: Diagnosis not present

## 2024-02-01 DIAGNOSIS — C50912 Malignant neoplasm of unspecified site of left female breast: Secondary | ICD-10-CM | POA: Diagnosis not present

## 2024-02-01 DIAGNOSIS — C50812 Malignant neoplasm of overlapping sites of left female breast: Secondary | ICD-10-CM | POA: Diagnosis not present

## 2024-02-01 DIAGNOSIS — Z170 Estrogen receptor positive status [ER+]: Secondary | ICD-10-CM | POA: Diagnosis not present

## 2024-02-01 DIAGNOSIS — L659 Nonscarring hair loss, unspecified: Secondary | ICD-10-CM | POA: Diagnosis not present

## 2024-02-01 DIAGNOSIS — C773 Secondary and unspecified malignant neoplasm of axilla and upper limb lymph nodes: Secondary | ICD-10-CM | POA: Diagnosis not present

## 2024-02-04 DIAGNOSIS — Z23 Encounter for immunization: Secondary | ICD-10-CM | POA: Diagnosis not present

## 2024-02-04 DIAGNOSIS — C50912 Malignant neoplasm of unspecified site of left female breast: Secondary | ICD-10-CM | POA: Diagnosis not present

## 2024-02-04 DIAGNOSIS — C773 Secondary and unspecified malignant neoplasm of axilla and upper limb lymph nodes: Secondary | ICD-10-CM | POA: Diagnosis not present

## 2024-02-04 DIAGNOSIS — Z79632 Long term (current) use of antitumor antibiotic: Secondary | ICD-10-CM | POA: Diagnosis not present

## 2024-02-04 DIAGNOSIS — Z170 Estrogen receptor positive status [ER+]: Secondary | ICD-10-CM | POA: Diagnosis not present

## 2024-02-04 DIAGNOSIS — Z79630 Long term (current) use of alkylating agent: Secondary | ICD-10-CM | POA: Diagnosis not present

## 2024-02-04 DIAGNOSIS — Z5111 Encounter for antineoplastic chemotherapy: Secondary | ICD-10-CM | POA: Diagnosis not present

## 2024-02-10 DIAGNOSIS — C50012 Malignant neoplasm of nipple and areola, left female breast: Secondary | ICD-10-CM | POA: Diagnosis not present

## 2024-02-10 DIAGNOSIS — C773 Secondary and unspecified malignant neoplasm of axilla and upper limb lymph nodes: Secondary | ICD-10-CM | POA: Diagnosis not present

## 2024-02-10 DIAGNOSIS — C50811 Malignant neoplasm of overlapping sites of right female breast: Secondary | ICD-10-CM | POA: Diagnosis not present

## 2024-02-18 DIAGNOSIS — C773 Secondary and unspecified malignant neoplasm of axilla and upper limb lymph nodes: Secondary | ICD-10-CM | POA: Diagnosis not present

## 2024-02-18 DIAGNOSIS — Z23 Encounter for immunization: Secondary | ICD-10-CM | POA: Diagnosis not present

## 2024-02-18 DIAGNOSIS — Z17411 Hormone receptor positive with human epidermal growth factor receptor 2 negative status: Secondary | ICD-10-CM | POA: Diagnosis not present

## 2024-02-18 DIAGNOSIS — C50912 Malignant neoplasm of unspecified site of left female breast: Secondary | ICD-10-CM | POA: Diagnosis not present

## 2024-02-18 DIAGNOSIS — D701 Agranulocytosis secondary to cancer chemotherapy: Secondary | ICD-10-CM | POA: Diagnosis not present

## 2024-02-18 DIAGNOSIS — Z7182 Exercise counseling: Secondary | ICD-10-CM | POA: Diagnosis not present

## 2024-02-18 DIAGNOSIS — Z5111 Encounter for antineoplastic chemotherapy: Secondary | ICD-10-CM | POA: Diagnosis not present

## 2024-02-18 DIAGNOSIS — F419 Anxiety disorder, unspecified: Secondary | ICD-10-CM | POA: Diagnosis not present

## 2024-02-18 DIAGNOSIS — C50312 Malignant neoplasm of lower-inner quadrant of left female breast: Secondary | ICD-10-CM | POA: Diagnosis not present

## 2024-02-18 DIAGNOSIS — Z95828 Presence of other vascular implants and grafts: Secondary | ICD-10-CM | POA: Diagnosis not present

## 2024-02-18 DIAGNOSIS — C50812 Malignant neoplasm of overlapping sites of left female breast: Secondary | ICD-10-CM | POA: Diagnosis not present

## 2024-02-18 DIAGNOSIS — K1230 Oral mucositis (ulcerative), unspecified: Secondary | ICD-10-CM | POA: Diagnosis not present

## 2024-03-03 DIAGNOSIS — Z5111 Encounter for antineoplastic chemotherapy: Secondary | ICD-10-CM | POA: Diagnosis not present

## 2024-03-03 DIAGNOSIS — C50912 Malignant neoplasm of unspecified site of left female breast: Secondary | ICD-10-CM | POA: Diagnosis not present

## 2024-03-03 DIAGNOSIS — Z7182 Exercise counseling: Secondary | ICD-10-CM | POA: Diagnosis not present

## 2024-03-03 DIAGNOSIS — Z23 Encounter for immunization: Secondary | ICD-10-CM | POA: Diagnosis not present

## 2024-03-03 DIAGNOSIS — C773 Secondary and unspecified malignant neoplasm of axilla and upper limb lymph nodes: Secondary | ICD-10-CM | POA: Diagnosis not present

## 2024-03-03 DIAGNOSIS — C50312 Malignant neoplasm of lower-inner quadrant of left female breast: Secondary | ICD-10-CM | POA: Diagnosis not present

## 2024-03-03 DIAGNOSIS — D649 Anemia, unspecified: Secondary | ICD-10-CM | POA: Diagnosis not present

## 2024-03-03 DIAGNOSIS — Z79899 Other long term (current) drug therapy: Secondary | ICD-10-CM | POA: Diagnosis not present

## 2024-03-03 DIAGNOSIS — R110 Nausea: Secondary | ICD-10-CM | POA: Diagnosis not present

## 2024-03-03 DIAGNOSIS — K1230 Oral mucositis (ulcerative), unspecified: Secondary | ICD-10-CM | POA: Diagnosis not present

## 2024-03-09 DIAGNOSIS — R590 Localized enlarged lymph nodes: Secondary | ICD-10-CM | POA: Diagnosis not present

## 2024-03-09 DIAGNOSIS — C50812 Malignant neoplasm of overlapping sites of left female breast: Secondary | ICD-10-CM | POA: Diagnosis not present

## 2024-03-09 DIAGNOSIS — Z170 Estrogen receptor positive status [ER+]: Secondary | ICD-10-CM | POA: Diagnosis not present

## 2024-03-17 DIAGNOSIS — Z170 Estrogen receptor positive status [ER+]: Secondary | ICD-10-CM | POA: Diagnosis not present

## 2024-03-17 DIAGNOSIS — C773 Secondary and unspecified malignant neoplasm of axilla and upper limb lymph nodes: Secondary | ICD-10-CM | POA: Diagnosis not present

## 2024-03-17 DIAGNOSIS — K2960 Other gastritis without bleeding: Secondary | ICD-10-CM | POA: Diagnosis not present

## 2024-03-17 DIAGNOSIS — R110 Nausea: Secondary | ICD-10-CM | POA: Diagnosis not present

## 2024-03-17 DIAGNOSIS — Z23 Encounter for immunization: Secondary | ICD-10-CM | POA: Diagnosis not present

## 2024-03-17 DIAGNOSIS — C50812 Malignant neoplasm of overlapping sites of left female breast: Secondary | ICD-10-CM | POA: Diagnosis not present

## 2024-03-17 DIAGNOSIS — Z1732 Human epidermal growth factor receptor 2 negative status: Secondary | ICD-10-CM | POA: Diagnosis not present

## 2024-03-17 DIAGNOSIS — Z95828 Presence of other vascular implants and grafts: Secondary | ICD-10-CM | POA: Diagnosis not present

## 2024-03-17 DIAGNOSIS — R059 Cough, unspecified: Secondary | ICD-10-CM | POA: Diagnosis not present

## 2024-03-17 DIAGNOSIS — K1230 Oral mucositis (ulcerative), unspecified: Secondary | ICD-10-CM | POA: Diagnosis not present

## 2024-03-17 DIAGNOSIS — K219 Gastro-esophageal reflux disease without esophagitis: Secondary | ICD-10-CM | POA: Diagnosis not present

## 2024-03-17 DIAGNOSIS — C50912 Malignant neoplasm of unspecified site of left female breast: Secondary | ICD-10-CM | POA: Diagnosis not present

## 2024-03-17 DIAGNOSIS — Z1721 Progesterone receptor positive status: Secondary | ICD-10-CM | POA: Diagnosis not present

## 2024-03-17 DIAGNOSIS — Z79899 Other long term (current) drug therapy: Secondary | ICD-10-CM | POA: Diagnosis not present

## 2024-03-17 DIAGNOSIS — Z7182 Exercise counseling: Secondary | ICD-10-CM | POA: Diagnosis not present

## 2024-03-23 DIAGNOSIS — C773 Secondary and unspecified malignant neoplasm of axilla and upper limb lymph nodes: Secondary | ICD-10-CM | POA: Diagnosis not present

## 2024-03-23 DIAGNOSIS — Z95828 Presence of other vascular implants and grafts: Secondary | ICD-10-CM | POA: Diagnosis not present

## 2024-03-23 DIAGNOSIS — C50312 Malignant neoplasm of lower-inner quadrant of left female breast: Secondary | ICD-10-CM | POA: Diagnosis not present

## 2024-03-23 DIAGNOSIS — K1230 Oral mucositis (ulcerative), unspecified: Secondary | ICD-10-CM | POA: Diagnosis not present

## 2024-03-23 DIAGNOSIS — C50912 Malignant neoplasm of unspecified site of left female breast: Secondary | ICD-10-CM | POA: Diagnosis not present

## 2024-03-23 DIAGNOSIS — R0989 Other specified symptoms and signs involving the circulatory and respiratory systems: Secondary | ICD-10-CM | POA: Diagnosis not present

## 2024-03-23 DIAGNOSIS — Z7182 Exercise counseling: Secondary | ICD-10-CM | POA: Diagnosis not present

## 2024-03-23 DIAGNOSIS — Z23 Encounter for immunization: Secondary | ICD-10-CM | POA: Diagnosis not present

## 2024-03-23 DIAGNOSIS — Z5111 Encounter for antineoplastic chemotherapy: Secondary | ICD-10-CM | POA: Diagnosis not present

## 2024-03-23 DIAGNOSIS — Z17411 Hormone receptor positive with human epidermal growth factor receptor 2 negative status: Secondary | ICD-10-CM | POA: Diagnosis not present

## 2024-03-23 DIAGNOSIS — K219 Gastro-esophageal reflux disease without esophagitis: Secondary | ICD-10-CM | POA: Diagnosis not present

## 2024-04-07 DIAGNOSIS — Z79899 Other long term (current) drug therapy: Secondary | ICD-10-CM | POA: Diagnosis not present

## 2024-04-07 DIAGNOSIS — R059 Cough, unspecified: Secondary | ICD-10-CM | POA: Diagnosis not present

## 2024-04-07 DIAGNOSIS — C50912 Malignant neoplasm of unspecified site of left female breast: Secondary | ICD-10-CM | POA: Diagnosis not present

## 2024-04-07 DIAGNOSIS — K1230 Oral mucositis (ulcerative), unspecified: Secondary | ICD-10-CM | POA: Diagnosis not present

## 2024-04-07 DIAGNOSIS — Z5111 Encounter for antineoplastic chemotherapy: Secondary | ICD-10-CM | POA: Diagnosis not present

## 2024-04-07 DIAGNOSIS — R0989 Other specified symptoms and signs involving the circulatory and respiratory systems: Secondary | ICD-10-CM | POA: Diagnosis not present

## 2024-04-07 DIAGNOSIS — C50312 Malignant neoplasm of lower-inner quadrant of left female breast: Secondary | ICD-10-CM | POA: Diagnosis not present

## 2024-04-07 DIAGNOSIS — C773 Secondary and unspecified malignant neoplasm of axilla and upper limb lymph nodes: Secondary | ICD-10-CM | POA: Diagnosis not present

## 2024-04-07 DIAGNOSIS — R110 Nausea: Secondary | ICD-10-CM | POA: Diagnosis not present

## 2024-04-07 DIAGNOSIS — K219 Gastro-esophageal reflux disease without esophagitis: Secondary | ICD-10-CM | POA: Diagnosis not present

## 2024-04-07 DIAGNOSIS — Z23 Encounter for immunization: Secondary | ICD-10-CM | POA: Diagnosis not present

## 2024-04-18 DIAGNOSIS — C773 Secondary and unspecified malignant neoplasm of axilla and upper limb lymph nodes: Secondary | ICD-10-CM | POA: Diagnosis not present

## 2024-04-18 DIAGNOSIS — C50912 Malignant neoplasm of unspecified site of left female breast: Secondary | ICD-10-CM | POA: Diagnosis not present

## 2024-04-26 DIAGNOSIS — C773 Secondary and unspecified malignant neoplasm of axilla and upper limb lymph nodes: Secondary | ICD-10-CM | POA: Diagnosis not present

## 2024-04-26 DIAGNOSIS — C50912 Malignant neoplasm of unspecified site of left female breast: Secondary | ICD-10-CM | POA: Diagnosis not present

## 2024-04-26 DIAGNOSIS — Z23 Encounter for immunization: Secondary | ICD-10-CM | POA: Diagnosis not present

## 2024-05-05 ENCOUNTER — Encounter (HOSPITAL_BASED_OUTPATIENT_CLINIC_OR_DEPARTMENT_OTHER): Payer: Self-pay | Admitting: Obstetrics & Gynecology

## 2024-05-05 NOTE — Progress Notes (Signed)
 Virtual Visit via Video Note  I connected with Brandy Parsons on 05/06/2024 at  9:35 AM EST by a video enabled telemedicine application and verified that I am speaking with the correct person using two identifiers. Patient to discuss breast cancer treatment follow up.  Location: Patient: home Provider: secure office   I discussed the limitations of evaluation and management by telemedicine and the availability of in person appointments. The patient expressed understanding and agreed to proceed.  History of Present Illness: 54 yo G2P2 MW female with virtual visit to discuss plan of care.  Diagnosed with Stage IIIC invasive inflammatory breast cancer which was multifocal and with metastasis to the axilla.  She has negative genetic testing.  Initially care was started at [facility] but transferred due [facility].  Has port placement and has been receiving chemotherapy with anthracycline-base NAC.  Breast has changed significantly with decreased signs/symptoms of the cancer including improvement in axilla now.    Is gathering information about what is the best surgical treatment for her and for her reconstruction.  She will need mastectomy and is going to have Targeted LND.  There has been some recommendations between oncologist and surgeon but she feels comfortable with this.  Has seen plastic surgeon as well and considered expander/implant reconstruction, autologous DIEP flap and oncoplastic reduction/lift.  Is leaning towards expander and implant.  Wants my input and we discussed pros/cons.  She is leaning towards expander and implants.  Will then proceed with radiation.    Some other questions are about bone density screening.  She's not sure at this time if she will be on tamoxifen or aromatase inhibitor.  Will depend on pathology with surgery.  We discussed side effects on bones so I would recommend baseline before starting adjuvant therapy.  She is aware I can order this.    Also concerned about menopausal  symptoms.  Doing well at this time but if has new symptoms we discussed SSRI use, Gabapentin, veozah and vaginal health options.  Doesn't feel she needs anything at this time.  Biggest issue now is sleep.  Wakes up several times nightly.  Trazodone discussed.  She doesn't want this yet but knows she can let me know if changes mind.     Observations/Objective: WNWD WF, NAD  Assessment and Plan: 1. Malignant neoplasm of overlapping sites of left breast in female, estrogen receptor positive (HCC) (Primary) - pt has post chemotherapy imaging scheduled in February.  Final decisions about surgery will be made afterwards - will have reconstruction as well - then will proceed with radiation and adjuvant therapy - knows she can call with any questions/concerns.    Follow Up Instructions: I discussed the assessment and treatment plan with the patient. The patient was provided an opportunity to ask questions and all were answered. The patient agreed with the plan and demonstrated an understanding of the instructions.   The patient was advised to call back or seek an in-person evaluation if the symptoms worsen or if the condition fails to improve as anticipated.  I provided 35 minutes of non-face-to-face time during this encounter.   Ronal GORMAN Pinal, MD

## 2024-05-06 ENCOUNTER — Encounter (HOSPITAL_BASED_OUTPATIENT_CLINIC_OR_DEPARTMENT_OTHER): Payer: Self-pay | Admitting: Obstetrics & Gynecology

## 2024-05-06 ENCOUNTER — Telehealth (INDEPENDENT_AMBULATORY_CARE_PROVIDER_SITE_OTHER): Admitting: Obstetrics & Gynecology

## 2024-05-06 DIAGNOSIS — Z170 Estrogen receptor positive status [ER+]: Secondary | ICD-10-CM

## 2024-05-06 DIAGNOSIS — C50812 Malignant neoplasm of overlapping sites of left female breast: Secondary | ICD-10-CM

## 2024-05-18 ENCOUNTER — Telehealth (HOSPITAL_BASED_OUTPATIENT_CLINIC_OR_DEPARTMENT_OTHER): Payer: Self-pay | Admitting: Obstetrics & Gynecology
